# Patient Record
Sex: Female | Born: 1940 | Race: White | Hispanic: No | Marital: Married | Smoking: Never smoker
Health system: Southern US, Community
[De-identification: ages and names within clinical notes are randomized; demographics above are authoritative.]

## PROBLEM LIST (undated history)

## (undated) DIAGNOSIS — T4145XA Adverse effect of unspecified anesthetic, initial encounter: Secondary | ICD-10-CM

## (undated) DIAGNOSIS — Z8489 Family history of other specified conditions: Secondary | ICD-10-CM

## (undated) DIAGNOSIS — T8859XA Other complications of anesthesia, initial encounter: Secondary | ICD-10-CM

## (undated) HISTORY — PX: FOOT SURGERY: SHX648

## (undated) HISTORY — PX: BASAL CELL CARCINOMA EXCISION: SHX1214

## (undated) HISTORY — PX: MULTIPLE TOOTH EXTRACTIONS: SHX2053

## (undated) HISTORY — PX: OTHER SURGICAL HISTORY: SHX169

## (undated) HISTORY — PX: TONSILLECTOMY: SUR1361

---

## 1972-06-25 HISTORY — PX: TUBAL LIGATION: SHX77

## 1979-06-26 DIAGNOSIS — Z8489 Family history of other specified conditions: Secondary | ICD-10-CM

## 1979-06-26 HISTORY — DX: Family history of other specified conditions: Z84.89

## 1982-06-25 HISTORY — PX: DIAGNOSTIC LAPAROSCOPY: SUR761

## 1998-05-30 ENCOUNTER — Other Ambulatory Visit: Admission: RE | Admit: 1998-05-30 | Discharge: 1998-05-30 | Payer: Self-pay | Admitting: *Deleted

## 1998-06-25 HISTORY — PX: CHOLECYSTECTOMY: SHX55

## 1999-03-02 ENCOUNTER — Encounter: Payer: Self-pay | Admitting: Surgery

## 1999-03-02 ENCOUNTER — Encounter (INDEPENDENT_AMBULATORY_CARE_PROVIDER_SITE_OTHER): Payer: Self-pay | Admitting: Specialist

## 1999-03-02 ENCOUNTER — Observation Stay (HOSPITAL_COMMUNITY): Admission: RE | Admit: 1999-03-02 | Discharge: 1999-03-03 | Payer: Self-pay | Admitting: Surgery

## 1999-06-12 ENCOUNTER — Other Ambulatory Visit: Admission: RE | Admit: 1999-06-12 | Discharge: 1999-06-12 | Payer: Self-pay | Admitting: *Deleted

## 2002-10-28 ENCOUNTER — Other Ambulatory Visit: Admission: RE | Admit: 2002-10-28 | Discharge: 2002-10-28 | Payer: Self-pay | Admitting: *Deleted

## 2003-03-02 ENCOUNTER — Ambulatory Visit (HOSPITAL_COMMUNITY): Admission: RE | Admit: 2003-03-02 | Discharge: 2003-03-02 | Payer: Self-pay | Admitting: Gastroenterology

## 2004-03-01 ENCOUNTER — Other Ambulatory Visit: Admission: RE | Admit: 2004-03-01 | Discharge: 2004-03-01 | Payer: Self-pay | Admitting: *Deleted

## 2005-07-05 ENCOUNTER — Other Ambulatory Visit: Admission: RE | Admit: 2005-07-05 | Discharge: 2005-07-05 | Payer: Self-pay | Admitting: Obstetrics and Gynecology

## 2006-12-23 ENCOUNTER — Other Ambulatory Visit: Admission: RE | Admit: 2006-12-23 | Discharge: 2006-12-23 | Payer: Self-pay | Admitting: Obstetrics and Gynecology

## 2008-03-02 ENCOUNTER — Other Ambulatory Visit: Admission: RE | Admit: 2008-03-02 | Discharge: 2008-03-02 | Payer: Self-pay | Admitting: Obstetrics and Gynecology

## 2010-11-10 NOTE — Op Note (Signed)
   NAME:  Alison Lamb, Alison Lamb                     ACCOUNT NO.:  [id number]   MEDICAL RECORD NO.:  [id number]                   PATIENT TYPE:  AMB   LOCATION:  ENDO                                 FACILITY:  [facility]   PHYSICIAN:  Graylin Shiver, M.D.                DATE OF BIRTH:  [birth year]   DATE OF PROCEDURE:  03/02/2003  DATE OF DISCHARGE:                                 OPERATIVE REPORT   PROCEDURE:  Colonoscopy.   INDICATION FOR PROCEDURE:  Family history of colon cancer.   Informed consent was obtained after explanation of the risks of bleeding,  infection, and perforation.   PREMEDICATION:  Fentanyl 80 mcg IV, Versed 8 mg IV.   DESCRIPTION OF PROCEDURE:  With the patient in the left lateral decubitus  position, a rectal exam was performed and no masses were felt.  The Olympus  colonoscope was inserted into the rectum and advanced around the colon to  the cecum.  Cecal landmarks were identified.  The cecum and ascending colon  were normal, the transverse colon normal.  The descending and sigmoid  revealed diverticulosis.  The rectum was normal.  She tolerated the  procedure well without complications.   IMPRESSION:  Left-sided diverticulosis.   In view of the family history of colon cancer in her father, I would  recommend a follow-up colonoscopy again in five years.                                               Graylin Shiver, M.D.    Germain Osgood  D:  03/02/2003  T:  03/02/2003  Job:  [id number]   cc:   Cassell Clement, M.D.  [address], [address]  [place]  [place] [address]  Fax: [phone]

## 2013-07-14 ENCOUNTER — Other Ambulatory Visit: Payer: Self-pay | Admitting: Gastroenterology

## 2013-07-14 DIAGNOSIS — K5732 Diverticulitis of large intestine without perforation or abscess without bleeding: Secondary | ICD-10-CM

## 2013-07-16 ENCOUNTER — Ambulatory Visit
Admission: RE | Admit: 2013-07-16 | Discharge: 2013-07-16 | Disposition: A | Discharge status: Home / Self Care | Payer: Medicare Other | Source: Ambulatory Visit | Attending: Gastroenterology | Admitting: Gastroenterology

## 2013-07-16 DIAGNOSIS — K5732 Diverticulitis of large intestine without perforation or abscess without bleeding: Secondary | ICD-10-CM

## 2013-07-16 MED ORDER — IOHEXOL 300 MG/ML  SOLN
100.0000 mL | Freq: Once | INTRAMUSCULAR | Status: AC | PRN
  Administered 2013-07-16: 100 mL via INTRAVENOUS

## 2015-03-23 IMAGING — CT CT ABD-PELV W/ CM
2 of 5 series · 17 of 46 positions shown, 19 images · IV contrast (READICAT/WATER & [ID] OMNI 300)
Comparison: None.

CLINICAL DATA: Left lower quadrant pain, history of diverticulitis,
finished antibiotics 4 days ago

EXAM:
CT ABDOMEN AND PELVIS WITH CONTRAST
TECHNIQUE: Multidetector CT imaging of the abdomen and pelvis was performed
using the standard protocol following bolus administration of
intravenous contrast.
BUN and creatinine were obtained on site at [HOSPITAL] at
[HOSPITAL].
Results:  BUN 9 mg/dL,  Creatinine 0.9 mg/dL.
CONTRAST:  100mL OMNIPAQUE IOHEXOL 300 MG/ML  SOLN

[Series 2: abd/pelvis with · axial · 0.74mm/px · z∈[-332,+53]mm · 14 of 87 slices shown, 16 images]
[im 5/87  soft-tissue]
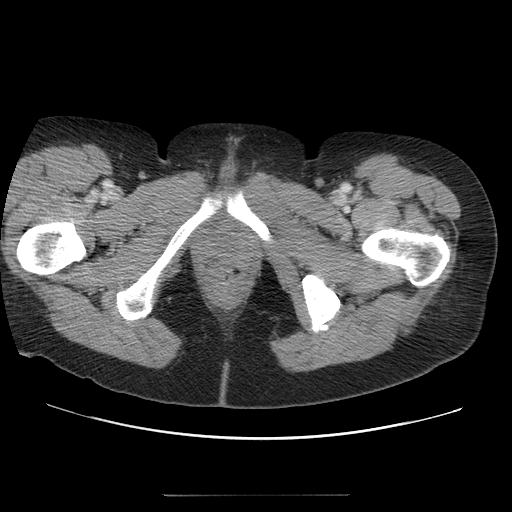
[im 5/87  bone]
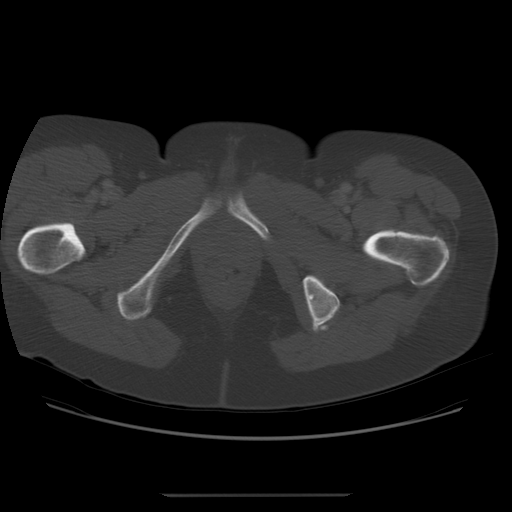
[im 10/87  soft-tissue]
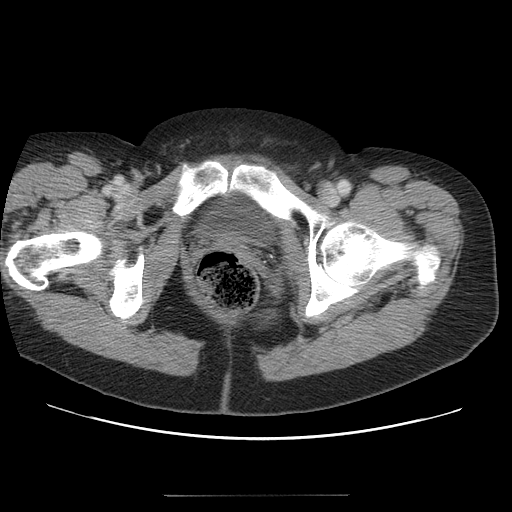
[im 19/87  soft-tissue]
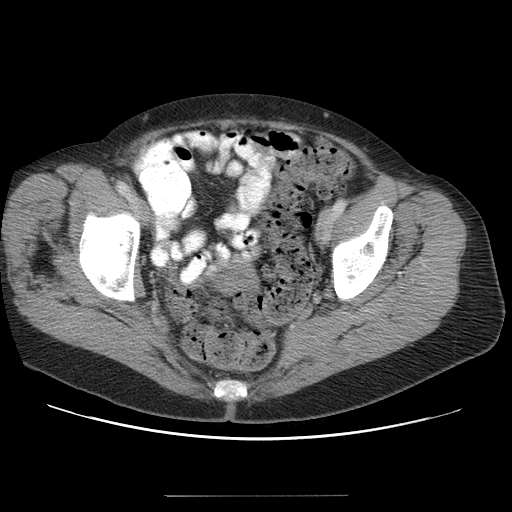
[im 23/87  soft-tissue]
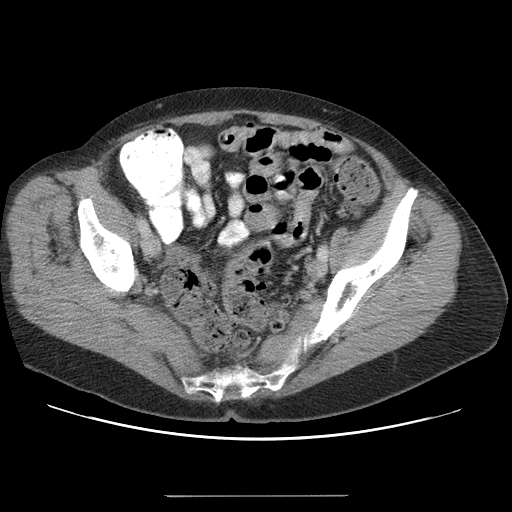
[im 28/87  soft-tissue]
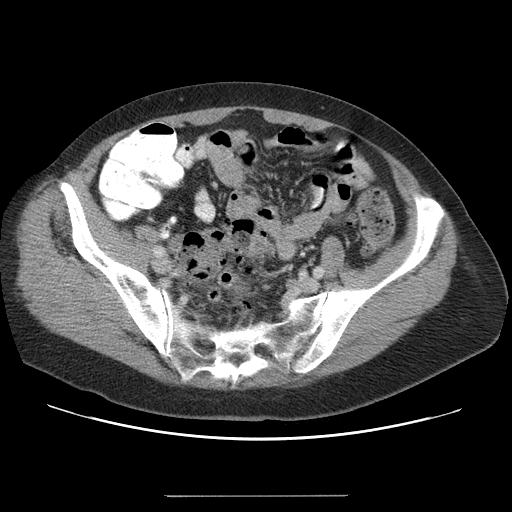
[im 37/87  soft-tissue]
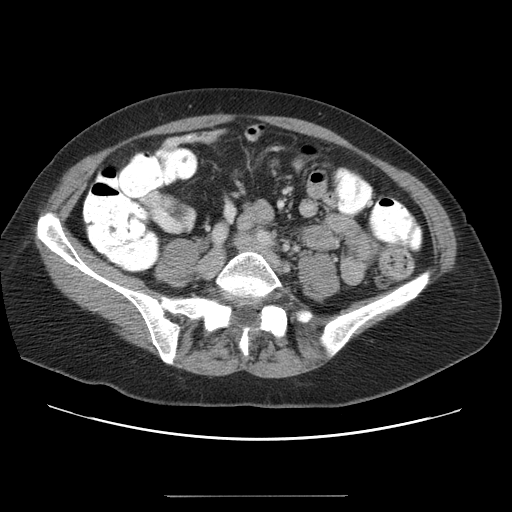
[im 41/87  soft-tissue]
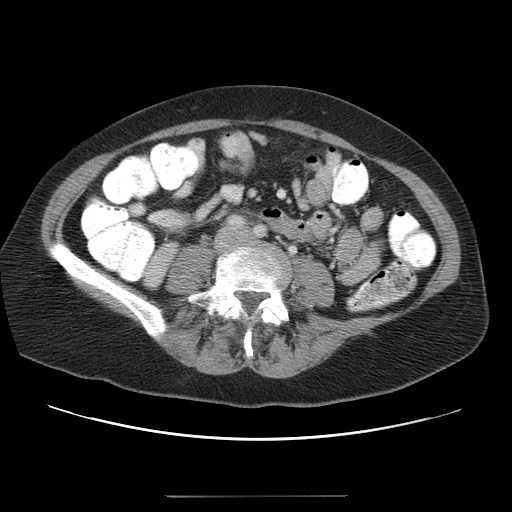
[im 46/87  soft-tissue]
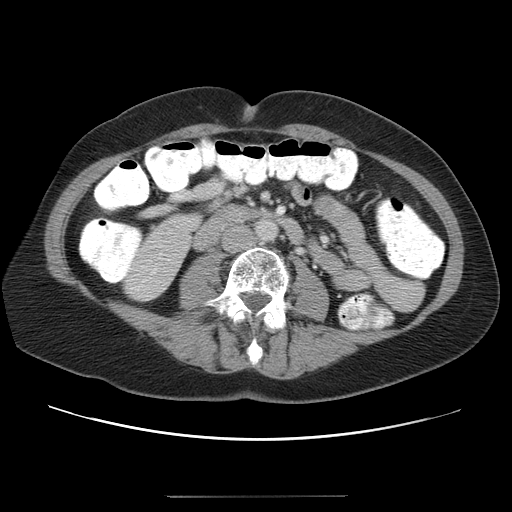
[im 50/87  soft-tissue]
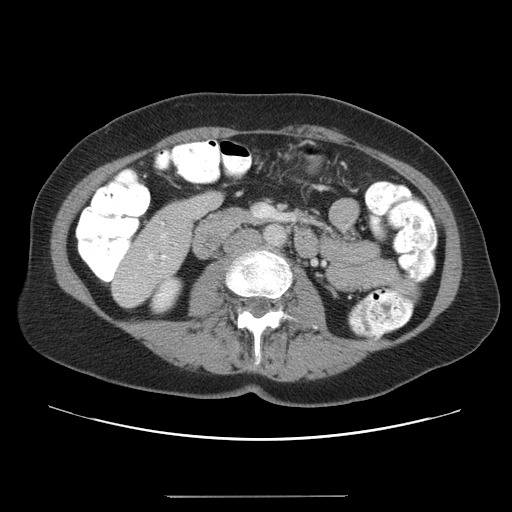
[im 50/87  bone]
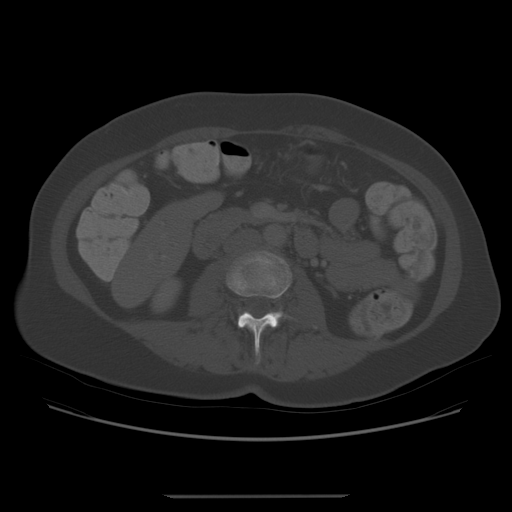
[im 59/87  soft-tissue]
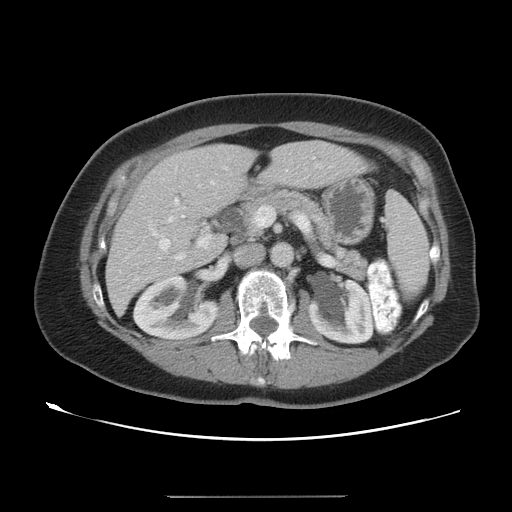
[im 64/87  soft-tissue]
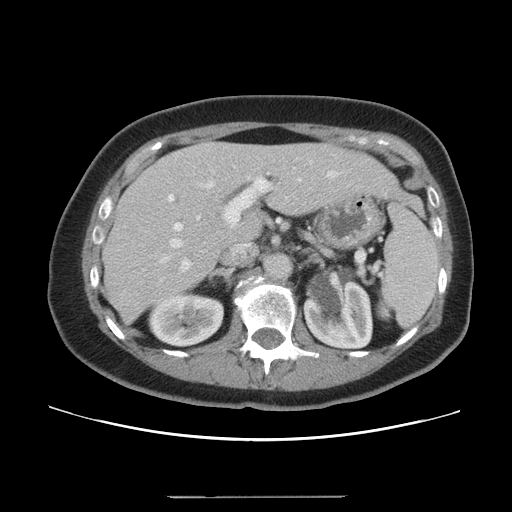
[im 68/87  soft-tissue]
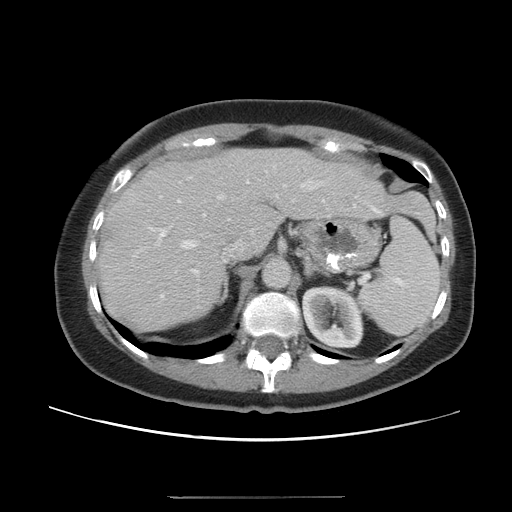
[im 77/87  soft-tissue]
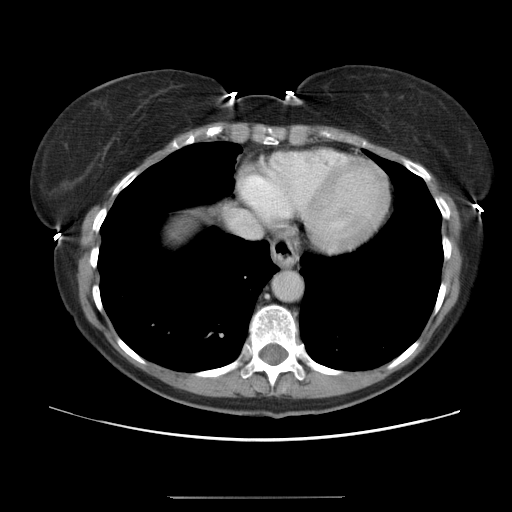
[im 82/87  soft-tissue]
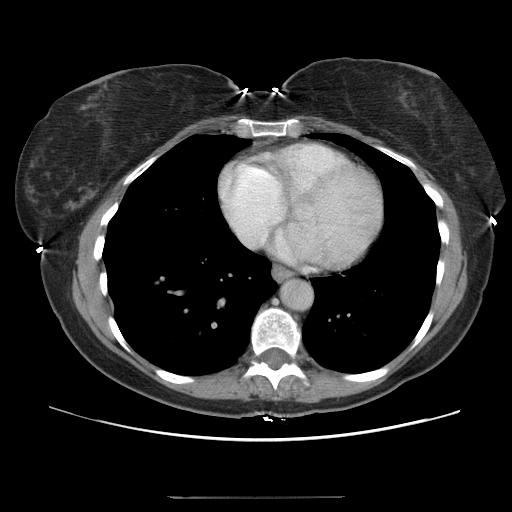

[Series 400: coronal · coronal · 0.92mm/px · 3 of 113 slices shown]
[im 38/113  soft-tissue]
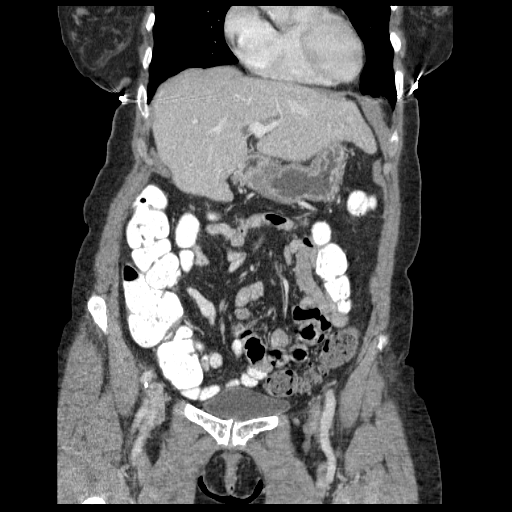
[im 50/113  soft-tissue]
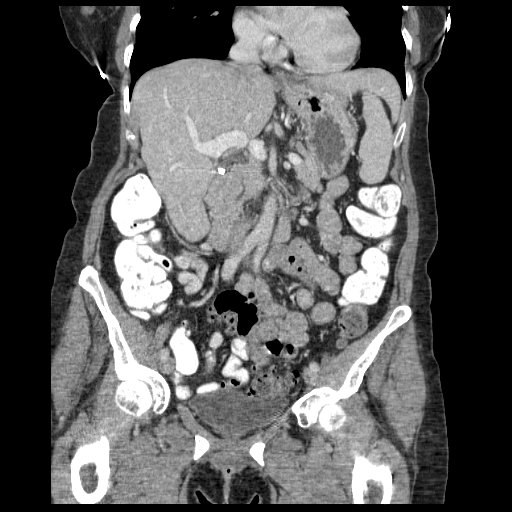
[im 63/113  soft-tissue]
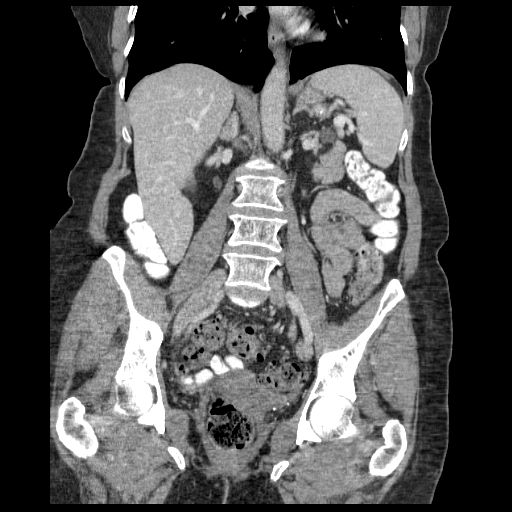

[17 of 46 positions shown; findings below may reference images not displayed]

FINDINGS: Mild dependent atelectasis in the lung bases.

Small hiatal hernia.

Liver, spleen, pancreas, adrenal glands are within normal limits.

Status post cholecystectomy. No intrahepatic ductal dilatation.
Common duct measures 9 mm, within normal limits for postsurgical
appearance.

Bilateral renal sinus cysts, left greater than right. Kidneys are
otherwise within normal limits. No hydronephrosis.

No evidence of bowel obstruction. Normal appendix. Extensive colonic
diverticulosis, without convincing inflammatory changes to suggest
acute diverticulitis.

No evidence of abdominal aortic aneurysm.

No abdominopelvic ascites.

No suspicious abdominopelvic lymphadenopathy.

Uterus and right ovary are unremarkable.  No left adnexal mass.

Bladder is within normal limits.

Mild degenerative changes of the visualized thoracolumbar spine.
IMPRESSION: Extensive colonic diverticulosis, without findings to suggest
diverticulitis.

No evidence of bowel obstruction.  Normal appendix.

Bilateral renal sinus cysts.  No hydronephrosis.

## 2016-07-04 DIAGNOSIS — J322 Chronic ethmoidal sinusitis: Secondary | ICD-10-CM | POA: Diagnosis not present

## 2016-07-04 DIAGNOSIS — H8141 Vertigo of central origin, right ear: Secondary | ICD-10-CM | POA: Diagnosis not present

## 2016-07-04 DIAGNOSIS — J040 Acute laryngitis: Secondary | ICD-10-CM | POA: Diagnosis not present

## 2016-07-04 DIAGNOSIS — J320 Chronic maxillary sinusitis: Secondary | ICD-10-CM | POA: Diagnosis not present

## 2016-07-04 DIAGNOSIS — H9311 Tinnitus, right ear: Secondary | ICD-10-CM | POA: Diagnosis not present

## 2016-12-28 DIAGNOSIS — Z1231 Encounter for screening mammogram for malignant neoplasm of breast: Secondary | ICD-10-CM | POA: Diagnosis not present

## 2017-01-10 DIAGNOSIS — H52223 Regular astigmatism, bilateral: Secondary | ICD-10-CM | POA: Diagnosis not present

## 2017-01-10 DIAGNOSIS — H5203 Hypermetropia, bilateral: Secondary | ICD-10-CM | POA: Diagnosis not present

## 2017-01-10 DIAGNOSIS — H04123 Dry eye syndrome of bilateral lacrimal glands: Secondary | ICD-10-CM | POA: Diagnosis not present

## 2017-01-10 DIAGNOSIS — H524 Presbyopia: Secondary | ICD-10-CM | POA: Diagnosis not present

## 2017-01-10 DIAGNOSIS — H25813 Combined forms of age-related cataract, bilateral: Secondary | ICD-10-CM | POA: Diagnosis not present

## 2017-01-15 DIAGNOSIS — L820 Inflamed seborrheic keratosis: Secondary | ICD-10-CM | POA: Diagnosis not present

## 2017-01-15 DIAGNOSIS — L720 Epidermal cyst: Secondary | ICD-10-CM | POA: Diagnosis not present

## 2017-01-15 DIAGNOSIS — Z85828 Personal history of other malignant neoplasm of skin: Secondary | ICD-10-CM | POA: Diagnosis not present

## 2017-01-15 DIAGNOSIS — D485 Neoplasm of uncertain behavior of skin: Secondary | ICD-10-CM | POA: Diagnosis not present

## 2017-01-15 DIAGNOSIS — L821 Other seborrheic keratosis: Secondary | ICD-10-CM | POA: Diagnosis not present

## 2017-01-15 DIAGNOSIS — D1801 Hemangioma of skin and subcutaneous tissue: Secondary | ICD-10-CM | POA: Diagnosis not present

## 2017-01-15 DIAGNOSIS — L570 Actinic keratosis: Secondary | ICD-10-CM | POA: Diagnosis not present

## 2017-01-15 DIAGNOSIS — L814 Other melanin hyperpigmentation: Secondary | ICD-10-CM | POA: Diagnosis not present

## 2017-03-06 DIAGNOSIS — Z23 Encounter for immunization: Secondary | ICD-10-CM | POA: Diagnosis not present

## 2017-03-18 DIAGNOSIS — M25562 Pain in left knee: Secondary | ICD-10-CM | POA: Diagnosis not present

## 2017-03-25 DIAGNOSIS — Z8489 Family history of other specified conditions: Secondary | ICD-10-CM

## 2017-03-25 HISTORY — DX: Family history of other specified conditions: Z84.89

## 2017-04-11 DIAGNOSIS — M25562 Pain in left knee: Secondary | ICD-10-CM | POA: Diagnosis not present

## 2017-04-19 DIAGNOSIS — M25562 Pain in left knee: Secondary | ICD-10-CM | POA: Diagnosis not present

## 2017-04-19 DIAGNOSIS — M238X2 Other internal derangements of left knee: Secondary | ICD-10-CM | POA: Diagnosis not present

## 2017-04-24 DIAGNOSIS — Z0000 Encounter for general adult medical examination without abnormal findings: Secondary | ICD-10-CM | POA: Diagnosis not present

## 2017-04-24 DIAGNOSIS — E781 Pure hyperglyceridemia: Secondary | ICD-10-CM | POA: Diagnosis not present

## 2017-04-24 DIAGNOSIS — Z1389 Encounter for screening for other disorder: Secondary | ICD-10-CM | POA: Diagnosis not present

## 2017-04-25 DIAGNOSIS — M23332 Other meniscus derangements, other medial meniscus, left knee: Secondary | ICD-10-CM | POA: Diagnosis not present

## 2017-04-25 DIAGNOSIS — M25562 Pain in left knee: Secondary | ICD-10-CM | POA: Diagnosis not present

## 2017-06-20 ENCOUNTER — Other Ambulatory Visit: Payer: Self-pay

## 2017-06-20 ENCOUNTER — Encounter (HOSPITAL_COMMUNITY): Payer: Self-pay | Admitting: *Deleted

## 2017-06-20 NOTE — Progress Notes (Signed)
PATIENT OFFERRED PRE OP APPOINTMENT WHEN TOLD SHE WAS BACK IN TOWN TODAY DUE TO HISTORY OF FAMILY  ANESTHESIA  ISSUES  AND PATIENT STATED " I CANNOT COME FOR A PRE OP APPOINMENT TODAY DUE  TO I  HAVE A HOUSE FULL OF GIRLS COMING OVER SOON "

## 2017-06-21 ENCOUNTER — Ambulatory Visit (HOSPITAL_COMMUNITY): Payer: PPO | Admitting: Anesthesiology

## 2017-06-21 ENCOUNTER — Encounter (HOSPITAL_COMMUNITY): Payer: Self-pay | Admitting: *Deleted

## 2017-06-21 ENCOUNTER — Ambulatory Visit (HOSPITAL_COMMUNITY)
Admission: RE | Admit: 2017-06-21 | Discharge: 2017-06-21 | Disposition: A | Discharge status: Home / Self Care | Payer: PPO | Source: Ambulatory Visit | Attending: Orthopedic Surgery | Admitting: Orthopedic Surgery

## 2017-06-21 ENCOUNTER — Encounter (HOSPITAL_COMMUNITY)
Admission: RE | Disposition: A | Discharge status: Home / Self Care | Payer: Self-pay | Source: Ambulatory Visit | Attending: Orthopedic Surgery

## 2017-06-21 DIAGNOSIS — M2242 Chondromalacia patellae, left knee: Secondary | ICD-10-CM | POA: Diagnosis not present

## 2017-06-21 DIAGNOSIS — Z85828 Personal history of other malignant neoplasm of skin: Secondary | ICD-10-CM | POA: Diagnosis not present

## 2017-06-21 DIAGNOSIS — Z5333 Arthroscopic surgical procedure converted to open procedure: Secondary | ICD-10-CM | POA: Diagnosis not present

## 2017-06-21 DIAGNOSIS — Z79899 Other long term (current) drug therapy: Secondary | ICD-10-CM | POA: Insufficient documentation

## 2017-06-21 DIAGNOSIS — S83282A Other tear of lateral meniscus, current injury, left knee, initial encounter: Secondary | ICD-10-CM | POA: Insufficient documentation

## 2017-06-21 DIAGNOSIS — S83242A Other tear of medial meniscus, current injury, left knee, initial encounter: Secondary | ICD-10-CM | POA: Insufficient documentation

## 2017-06-21 DIAGNOSIS — Y929 Unspecified place or not applicable: Secondary | ICD-10-CM | POA: Insufficient documentation

## 2017-06-21 DIAGNOSIS — M659 Synovitis and tenosynovitis, unspecified: Secondary | ICD-10-CM | POA: Diagnosis not present

## 2017-06-21 DIAGNOSIS — M1712 Unilateral primary osteoarthritis, left knee: Secondary | ICD-10-CM | POA: Diagnosis not present

## 2017-06-21 DIAGNOSIS — M65862 Other synovitis and tenosynovitis, left lower leg: Secondary | ICD-10-CM | POA: Diagnosis not present

## 2017-06-21 DIAGNOSIS — X58XXXA Exposure to other specified factors, initial encounter: Secondary | ICD-10-CM | POA: Insufficient documentation

## 2017-06-21 DIAGNOSIS — M23304 Other meniscus derangements, unspecified medial meniscus, left knee: Secondary | ICD-10-CM | POA: Diagnosis not present

## 2017-06-21 DIAGNOSIS — M23301 Other meniscus derangements, unspecified lateral meniscus, left knee: Secondary | ICD-10-CM | POA: Diagnosis not present

## 2017-06-21 HISTORY — DX: Other complications of anesthesia, initial encounter: T88.59XA

## 2017-06-21 HISTORY — PX: KNEE ARTHROSCOPY WITH MEDIAL MENISECTOMY: SHX5651

## 2017-06-21 HISTORY — DX: Adverse effect of unspecified anesthetic, initial encounter: T41.45XA

## 2017-06-21 HISTORY — DX: Family history of other specified conditions: Z84.89

## 2017-06-21 LAB — COMPREHENSIVE METABOLIC PANEL
ALT: 18 U/L (ref 14–54)
AST: 23 U/L (ref 15–41)
Albumin: 3.9 g/dL (ref 3.5–5.0)
Alkaline Phosphatase: 67 U/L (ref 38–126)
Anion gap: 7 (ref 5–15)
BUN: 14 mg/dL (ref 6–20)
CO2: 26 mmol/L (ref 22–32)
Calcium: 8.7 mg/dL — ABNORMAL LOW (ref 8.9–10.3)
Chloride: 107 mmol/L (ref 101–111)
Creatinine, Ser: 0.77 mg/dL (ref 0.44–1.00)
GFR calc Af Amer: >60 mL/min (ref >60)
GFR calc non Af Amer: >60 mL/min (ref >60)
Glucose, Bld: 97 mg/dL (ref 65–99)
Potassium: 3.7 mmol/L (ref 3.5–5.1)
Sodium: 140 mmol/L (ref 135–145)
Total Bilirubin: 0.6 mg/dL (ref 0.3–1.2)
Total Protein: 6.9 g/dL (ref 6.5–8.1)

## 2017-06-21 LAB — CBC WITH DIFFERENTIAL/PLATELET
Basophils Absolute: 0 10*3/uL (ref 0.0–0.1)
Basophils Relative: 0 %
Eosinophils Absolute: 0.1 10*3/uL (ref 0.0–0.7)
Eosinophils Relative: 2 %
HCT: 39.7 % (ref 36.0–46.0)
Hemoglobin: 13 g/dL (ref 12.0–15.0)
Lymphocytes Relative: 26 %
Lymphs Abs: 1.4 10*3/uL (ref 0.7–4.0)
MCH: 29.2 pg (ref 26.0–34.0)
MCHC: 32.7 g/dL (ref 30.0–36.0)
MCV: 89.2 fL (ref 78.0–100.0)
Monocytes Absolute: 0.4 10*3/uL (ref 0.1–1.0)
Monocytes Relative: 7 %
Neutro Abs: 3.6 10*3/uL (ref 1.7–7.7)
Neutrophils Relative %: 65 %
Platelets: 195 10*3/uL (ref 150–400)
RBC: 4.45 MIL/uL (ref 3.87–5.11)
RDW: 13.3 % (ref 11.5–15.5)
WBC: 5.6 10*3/uL (ref 4.0–10.5)

## 2017-06-21 LAB — PROTIME-INR
INR: 0.95
Prothrombin Time: 12.6 seconds (ref 11.4–15.2)

## 2017-06-21 LAB — APTT: aPTT: 30 seconds (ref 24–36)

## 2017-06-21 SURGERY — ARTHROSCOPY, KNEE, WITH MEDIAL MENISCECTOMY
Anesthesia: Spinal | Site: Knee | Laterality: Left

## 2017-06-21 MED ORDER — BUPIVACAINE-EPINEPHRINE 0.25% -1:200000 IJ SOLN
INTRAMUSCULAR | Status: DC | PRN
  Administered 2017-06-21: 20 mL

## 2017-06-21 MED ORDER — BACITRACIN ZINC 500 UNIT/GM EX OINT
TOPICAL_OINTMENT | CUTANEOUS | Status: AC
  Filled 2017-06-21: qty 28.35

## 2017-06-21 MED ORDER — PROPOFOL 10 MG/ML IV BOLUS
INTRAVENOUS | Status: AC
  Filled 2017-06-21: qty 20

## 2017-06-21 MED ORDER — BUPIVACAINE-EPINEPHRINE 0.25% -1:200000 IJ SOLN
INTRAMUSCULAR | Status: AC
  Filled 2017-06-21: qty 1

## 2017-06-21 MED ORDER — PHENYLEPHRINE 40 MCG/ML (10ML) SYRINGE FOR IV PUSH (FOR BLOOD PRESSURE SUPPORT)
PREFILLED_SYRINGE | INTRAVENOUS | Status: AC
  Filled 2017-06-21: qty 10

## 2017-06-21 MED ORDER — BACITRACIN ZINC 500 UNIT/GM EX OINT
TOPICAL_OINTMENT | CUTANEOUS | Status: DC | PRN
  Administered 2017-06-21: 1 via TOPICAL

## 2017-06-21 MED ORDER — CHLORHEXIDINE GLUCONATE 4 % EX LIQD: 60.0000 mL | Freq: Once | CUTANEOUS | Status: DC

## 2017-06-21 MED ORDER — DEXAMETHASONE SODIUM PHOSPHATE 10 MG/ML IJ SOLN
INTRAMUSCULAR | Status: AC
  Filled 2017-06-21: qty 1

## 2017-06-21 MED ORDER — SODIUM CHLORIDE 0.9% FLUSH: 3.0000 mL | Freq: Two times a day (BID) | INTRAVENOUS | Status: DC

## 2017-06-21 MED ORDER — PROPOFOL 10 MG/ML IV BOLUS
INTRAVENOUS | Status: AC
Start: 2017-06-21 — End: 2017-06-21
  Filled 2017-06-21: qty 40

## 2017-06-21 MED ORDER — ONDANSETRON HCL 4 MG/2ML IJ SOLN
INTRAMUSCULAR | Status: DC | PRN
  Administered 2017-06-21: 4 mg via INTRAVENOUS

## 2017-06-21 MED ORDER — SODIUM CHLORIDE 0.9 % IR SOLN
Status: DC | PRN
  Administered 2017-06-21: 12000 mL

## 2017-06-21 MED ORDER — PHENYLEPHRINE HCL 10 MG/ML IJ SOLN
INTRAMUSCULAR | Status: DC | PRN
  Administered 2017-06-21 (×2): 40 ug via INTRAVENOUS

## 2017-06-21 MED ORDER — LACTATED RINGERS IV SOLN
INTRAVENOUS | Status: DC
  Administered 2017-06-21 (×2): via INTRAVENOUS

## 2017-06-21 MED ORDER — SODIUM CHLORIDE 0.9% FLUSH: 3.0000 mL | INTRAVENOUS | Status: DC | PRN

## 2017-06-21 MED ORDER — ONDANSETRON HCL 4 MG/2ML IJ SOLN
INTRAMUSCULAR | Status: AC
  Filled 2017-06-21: qty 2

## 2017-06-21 MED ORDER — LIDOCAINE 2% (20 MG/ML) 5 ML SYRINGE
INTRAMUSCULAR | Status: AC
  Filled 2017-06-21: qty 5

## 2017-06-21 MED ORDER — CHLORHEXIDINE GLUCONATE 4 % EX LIQD
60.0000 mL | Freq: Once | CUTANEOUS | Status: AC
  Administered 2017-06-21: 4 via TOPICAL

## 2017-06-21 MED ORDER — CEFAZOLIN SODIUM-DEXTROSE 2-4 GM/100ML-% IV SOLN
2.0000 g | INTRAVENOUS | Status: AC
  Administered 2017-06-21: 2 g via INTRAVENOUS
  Filled 2017-06-21: qty 100

## 2017-06-21 MED ORDER — ACETAMINOPHEN 650 MG RE SUPP
650.0000 mg | RECTAL | Status: DC | PRN
  Filled 2017-06-21: qty 1

## 2017-06-21 MED ORDER — PROPOFOL 500 MG/50ML IV EMUL
INTRAVENOUS | Status: DC | PRN
  Administered 2017-06-21 (×2): 20 mg via INTRAVENOUS

## 2017-06-21 MED ORDER — PROPOFOL 500 MG/50ML IV EMUL
INTRAVENOUS | Status: DC | PRN
  Administered 2017-06-21: 50 ug/kg/min via INTRAVENOUS

## 2017-06-21 MED ORDER — OXYCODONE HCL 5 MG PO TABS: 5.0000 mg | ORAL_TABLET | ORAL | Status: DC | PRN

## 2017-06-21 MED ORDER — FENTANYL CITRATE (PF) 100 MCG/2ML IJ SOLN
INTRAMUSCULAR | Status: AC
  Filled 2017-06-21: qty 2

## 2017-06-21 MED ORDER — ACETAMINOPHEN 325 MG PO TABS: 650.0000 mg | ORAL_TABLET | ORAL | Status: DC | PRN

## 2017-06-21 MED ORDER — SODIUM CHLORIDE 0.9 % IV SOLN: 250.0000 mL | INTRAVENOUS | Status: DC | PRN

## 2017-06-21 MED ORDER — 0.9 % SODIUM CHLORIDE (POUR BTL) OPTIME
TOPICAL | Status: DC | PRN
  Administered 2017-06-21: 1000 mL

## 2017-06-21 SURGICAL SUPPLY — 30 items
BANDAGE ACE 4X5 VEL STRL LF (GAUZE/BANDAGES/DRESSINGS) ×2 IMPLANT
BANDAGE ACE 6X5 VEL STRL LF (GAUZE/BANDAGES/DRESSINGS) ×2 IMPLANT
BLADE GREAT WHITE 4.2 (BLADE) ×2 IMPLANT
BLADE SURG SZ11 CARB STEEL (BLADE) IMPLANT
BNDG COHESIVE 6X5 TAN STRL LF (GAUZE/BANDAGES/DRESSINGS) ×2 IMPLANT
CLOTH BEACON ORANGE TIMEOUT ST (SAFETY) ×2 IMPLANT
COUNTER NEEDLE 20 DBL MAG RED (NEEDLE) ×2 IMPLANT
COVER SURGICAL LIGHT HANDLE (MISCELLANEOUS) ×2 IMPLANT
DRAPE SHEET LG 3/4 BI-LAMINATE (DRAPES) ×2 IMPLANT
DRSG ADAPTIC 3X8 NADH LF (GAUZE/BANDAGES/DRESSINGS) ×2 IMPLANT
DRSG EMULSION OIL 3X16 NADH (GAUZE/BANDAGES/DRESSINGS) ×2 IMPLANT
DRSG PAD ABDOMINAL 8X10 ST (GAUZE/BANDAGES/DRESSINGS) ×4 IMPLANT
DURAPREP 26ML APPLICATOR (WOUND CARE) ×2 IMPLANT
GAUZE SPONGE 4X4 12PLY STRL (GAUZE/BANDAGES/DRESSINGS) ×2 IMPLANT
GLOVE BIOGEL PI IND STRL 8.5 (GLOVE) ×1 IMPLANT
GLOVE BIOGEL PI INDICATOR 8.5 (GLOVE) ×1
GLOVE ECLIPSE 8.0 STRL XLNG CF (GLOVE) ×2 IMPLANT
GOWN STRL REUS W/TWL XL LVL3 (GOWN DISPOSABLE) ×2 IMPLANT
KIT BASIN OR (CUSTOM PROCEDURE TRAY) IMPLANT
MANIFOLD NEPTUNE II (INSTRUMENTS) ×2 IMPLANT
PACK ARTHROSCOPY WL (CUSTOM PROCEDURE TRAY) ×2 IMPLANT
PACK ICE MAXI GEL EZY WRAP (MISCELLANEOUS) ×2 IMPLANT
PAD ABD 8X10 STRL (GAUZE/BANDAGES/DRESSINGS) ×2 IMPLANT
PAD MASON LEG HOLDER (PIN) ×2 IMPLANT
SUT ETHILON 3 0 PS 1 (SUTURE) ×2 IMPLANT
TOWEL OR 17X26 10 PK STRL BLUE (TOWEL DISPOSABLE) ×2 IMPLANT
TUBING ARTHRO INFLOW-ONLY STRL (TUBING) ×2 IMPLANT
TUBING CONNECTING 10 (TUBING) IMPLANT
WAND HAND CNTRL MULTIVAC 90 (MISCELLANEOUS) ×2 IMPLANT
WRAP KNEE MAXI GEL POST OP (GAUZE/BANDAGES/DRESSINGS) ×2 IMPLANT

## 2017-06-21 NOTE — H&P (Signed)
Alison Lamb is an 76 y.o. female.   Chief Complaint: Pain and swelling of her Left Knee. HPI: She complained of a painful range of motion of her Left Knee. She had a positive MRI of her Left knee which revealed a Torn Medial Meniscus.  Past Medical History:  Diagnosis Date  . Complication of anesthesia 20 YRS AGO   SLOW TO AWAKEN AFTER CHOLECYSTECTOMY  . Family history of adverse reaction to anesthesia 1981   FATHER DIED DURING COLON SURGERY DID NOT WAKE UP AFTER SURGERY FEW DETAILS GIVEN TO PATIENT   . Family history of anesthesia complication 23/5361   SISTER WITH PAIN AFTER GALLBLADDER SURGERY, YOUNGER SISTER TROUBLE TROUBLE WAKING UP AFTER KNEE SURGERY IN APRL 2018    Past Surgical History:  Procedure Laterality Date  . BASAL CELL CARCINOMA EXCISION     SEVERAL REMOVED  . CHOLECYSTECTOMY  2000  . COLONSCOPY  EVERY 5 YEARS  . DIAGNOSTIC LAPAROSCOPY  1984   FOR OVARY CYST  . FOOT SURGERY Right 1990'S   BONE SURGERY  . MOES PROCEDURE     X 2  . MULTIPLE TOOTH EXTRACTIONS    . TONSILLECTOMY  AGE 26  . TUBAL LIGATION  1974    History reviewed. No pertinent family history. Social History:  reports that  has never smoked. she has never used smokeless tobacco. She reports that she does not drink alcohol or use drugs.  Allergies: No Known Allergies  Medications Prior to Admission  Medication Sig Dispense Refill  . Ascorbic Acid (VITAMIN C PO) Take 1 tablet by mouth daily.    Marland Kitchen CALCIUM-VITAMIN D PO Take 1 tablet by mouth 4 (four) times a week.    Marland Kitchen ibuprofen (ADVIL,MOTRIN) 200 MG tablet Take 200-400 mg by mouth every 6 (six) hours as needed for headache or moderate pain.      Results for orders placed or performed during the hospital encounter of 06/21/17 (from the past 48 hour(s))  CBC WITH DIFFERENTIAL     Status: None   Collection Time: 06/21/17 11:25 AM  Result Value Ref Range   WBC 5.6 4.0 - 10.5 K/uL   RBC 4.45 3.87 - 5.11 MIL/uL   Hemoglobin 13.0 12.0 - 15.0  g/dL   HCT 39.7 36.0 - 46.0 %   MCV 89.2 78.0 - 100.0 fL   MCH 29.2 26.0 - 34.0 pg   MCHC 32.7 30.0 - 36.0 g/dL   RDW 13.3 11.5 - 15.5 %   Platelets 195 150 - 400 K/uL   Neutrophils Relative % 65 %   Neutro Abs 3.6 1.7 - 7.7 K/uL   Lymphocytes Relative 26 %   Lymphs Abs 1.4 0.7 - 4.0 K/uL   Monocytes Relative 7 %   Monocytes Absolute 0.4 0.1 - 1.0 K/uL   Eosinophils Relative 2 %   Eosinophils Absolute 0.1 0.0 - 0.7 K/uL   Basophils Relative 0 %   Basophils Absolute 0.0 0.0 - 0.1 K/uL  Comprehensive metabolic panel     Status: Abnormal   Collection Time: 06/21/17 11:25 AM  Result Value Ref Range   Sodium 140 135 - 145 mmol/L   Potassium 3.7 3.5 - 5.1 mmol/L   Chloride 107 101 - 111 mmol/L   CO2 26 22 - 32 mmol/L   Glucose, Bld 97 65 - 99 mg/dL   BUN 14 6 - 20 mg/dL   Creatinine, Ser 0.77 0.44 - 1.00 mg/dL   Calcium 8.7 (L) 8.9 - 10.3 mg/dL   Total  Protein 6.9 6.5 - 8.1 g/dL   Albumin 3.9 3.5 - 5.0 g/dL   AST 23 15 - 41 U/L   ALT 18 14 - 54 U/L   Alkaline Phosphatase 67 38 - 126 U/L   Total Bilirubin 0.6 0.3 - 1.2 mg/dL   GFR calc non Af Amer >60 >60 mL/min   GFR calc Af Amer >60 >60 mL/min    Comment: (NOTE) The eGFR has been calculated using the CKD EPI equation. This calculation has not been validated in all clinical situations. eGFR's persistently <60 mL/min signify possible Chronic Kidney Disease.    Anion gap 7 5 - 15  Protime-INR     Status: None   Collection Time: 06/21/17 11:25 AM  Result Value Ref Range   Prothrombin Time 12.6 11.4 - 15.2 seconds   INR 0.95   APTT     Status: None   Collection Time: 06/21/17 11:25 AM  Result Value Ref Range   aPTT 30 24 - 36 seconds   No results found.  Review of Systems  Constitutional: Negative.   HENT: Negative.   Eyes: Negative.   Respiratory: Negative.   Cardiovascular: Negative.   Gastrointestinal: Negative.   Genitourinary: Negative.   Musculoskeletal: Positive for joint pain.  Skin: Negative.    Neurological: Negative.   Endo/Heme/Allergies: Negative.   Psychiatric/Behavioral: Negative.     Blood pressure (!) 158/79, pulse 68, temperature 97.8 F (36.6 C), temperature source Oral, resp. rate 16, height 5' 6"  (1.676 m), weight 77.6 kg (171 lb), SpO2 100 %. Physical Exam  HENT:  Head: Normocephalic.  Eyes: Pupils are equal, round, and reactive to light.  Cardiovascular: Normal rate.  Respiratory: Effort normal.  GI: Soft.  Musculoskeletal:  Painful Range of Motion of her Left Knee.  Neurological: She is alert.  Skin: Skin is warm.  Psychiatric: She has a normal mood and affect.     Assessment/Plan Diagnostic arthroscopy and Medial Meniscectomy of her Left Knee.  Latanya Maudlin, MD 06/21/2017, 1:12 PM

## 2017-06-21 NOTE — Progress Notes (Signed)
Patient up to bathroom with walker after spinal for knee arthroscopy. Full weight bearing on left leg. Incontinent of urine in stretcher and when ambulating to bathroom.

## 2017-06-21 NOTE — Anesthesia Procedure Notes (Signed)
Performed by: Rashanda Magloire G, CRNA       

## 2017-06-21 NOTE — Interval H&P Note (Signed)
History and Physical Interval Note:  06/21/2017 1:17 PM  Alison Lamb [facility]  has presented today for surgery, with the diagnosis of Left knee medial meniscal tear  The various methods of treatment have been discussed with the patient and family. After consideration of risks, benefits and other options for treatment, the patient has consented to  Procedure(s): LEFT KNEE ARTHROSCOPY WITH MEDIAL MENISCAL DEBRIDEMENT (Left) as a surgical intervention .  The patient's history has been reviewed, patient examined, no change in status, stable for surgery.  I have reviewed the patient's chart and labs.  Questions were answered to the patient's satisfaction.     Latanya Maudlin

## 2017-06-21 NOTE — Transfer of Care (Signed)
Immediate Anesthesia Transfer of Care Note  Patient: Alison Lamb [facility]  Procedure(s) Performed: LEFT KNEE ARTHROSCOPY WITH MEDIAL MENISCAL DEBRIDEMENT (Left Knee)  Patient Location: PACU  Anesthesia Type:MAC and Spinal  Level of Consciousness: awake, alert  and oriented  Airway & Oxygen Therapy: Patient Spontanous Breathing and Patient connected to face mask oxygen  Post-op Assessment: Report given to RN and Post -op Vital signs reviewed and stable  Post vital signs: Reviewed and stable  Last Vitals:  Vitals:   06/21/17 1113  BP: (!) 158/79  Pulse: 68  Resp: 16  Temp: 36.6 C  SpO2: 100%    Last Pain:  Vitals:   06/21/17 1113  TempSrc: Oral         Complications: No apparent anesthesia complications

## 2017-06-21 NOTE — Anesthesia Procedure Notes (Signed)
Spinal  Patient location during procedure: OR Staffing Anesthesiologist: Lyndle Herrlich, MD Spinal Block Patient position: sitting Prep: DuraPrep Patient monitoring: heart rate, blood pressure and continuous pulse ox Approach: right paramedian Location: L3-4 Injection technique: single-shot Needle Needle type: Sprotte  Needle gauge: 24 G Needle length: 9 cm Assessment Sensory level: T4 Additional Notes Spinal Dosage in OR  .75% Bupivicaine ml       1.3 LLD x 3 min

## 2017-06-21 NOTE — Anesthesia Procedure Notes (Signed)
Date/Time: 06/21/2017 1:32 PM Performed by: Glory Buff, CRNA Oxygen Delivery Method: Simple face mask

## 2017-06-21 NOTE — Brief Op Note (Signed)
06/21/2017  2:46 PM  PATIENT:  Alison Lamb  76 y.o. female  PRE-OPERATIVE DIAGNOSIS:  Left knee medial meniscal tear and Lateral Meniscus tear. Chondro Malacia Left Knee.  POST-OPERATIVE DIAGNOSIS:  Same as Pre-Op  PROCEDURE:  Procedure(s): LEFT KNEE ARTHROSCOPY WITH MEDIAL MENISCAL DEBRIDEMENT (Left) Also, she had Partial Lateral Meniscectomy and Synovectomy of the Supra Patellar Pouch  SURGEON:  Surgeon(s) and Role:    Latanya Maudlin, MD - Primary    ASSISTANTS: OR Tech  ANESTHESIA:   spinal  EBL:  5cc  BLOOD ADMINISTERED:none  DRAINS: none   LOCAL MEDICATIONS USED:  MARCAINE  20cc of 0.50% with Epinephtine  SPECIMEN:  No Specimen  DISPOSITION OF SPECIMEN:  N/A  COUNTS:  YES  TOURNIQUET:  * No tourniquets in log *  DICTATION: .Other Dictation: Dictation Number [id number]  PLAN OF CARE: Discharge to home after PACU  PATIENT DISPOSITION:  PACU - hemodynamically stable.   Delay start of Pharmacological VTE agent (>24hrs) due to surgical blood loss or risk of bleeding: yes

## 2017-06-21 NOTE — Discharge Instructions (Signed)
Ambulate with walker,Full weight.  Take Aspirin 325mg m twice a day for two weeks. Call office for appointment in 12 Days. Remove dressings Sunday at noon and apply Bandaids each day and begin shower Sunday.   Spinal Anesthesia and Epidural Anesthesia, Care After These instructions give you information about caring for yourself after your procedure. Your doctor may also give you more specific instructions. Call your doctor if you have any problems or questions after your procedure. Follow these instructions at home: For at least 24 hours after the procedure:   Do not: ? Do activities where you could fall or get hurt (injured). ? Drive. ? Use heavy machinery. ? Drink alcohol. ? Take sleeping pills or medicines that make you sleepy (drowsy). ? Make important decisions. ? Sign legal documents. ? Take care of children on your own.  Rest. Eating and drinking  If you throw up (vomit), drink water, juice, or soup when you can drink without throwing up.  Make sure you do not feel like throwing up (are not nauseous) before you eat.  Follow the diet that is recommended by your doctor. General instructions  Have a responsible adult stay with you until you are awake and alert.  Take over-the-counter and prescription medicines only as told by your doctor.  If you smoke, do not smoke unless an adult is watching.  Keep all follow-up visits as told by your doctor. This is important. Contact a doctor if:  It has been more than one day since your procedure and you feel like throwing up.  It has been more than one day since your procedure and you throw up.  You have a rash. Get help right away if:  You have a fever.  You have a headache that lasts a long time.  You have a very bad headache.  Your vision is blurry.  You see two of a single object (double vision).  You are dizzy or light-headed.  You faint.  Your arms or legs tingle, feel weak, or get numb.  You have trouble  breathing.  You cannot pee (urinate). This information is not intended to replace advice given to you by your health care provider. Make sure you discuss any questions you have with your health care provider. Document Released: 10/03/2015 Document Revised: 02/02/2016 Document Reviewed: 10/03/2015 Elsevier Interactive Patient Education  [employer].

## 2017-06-21 NOTE — Brief Op Note (Signed)
06/21/2017  3:00 PM  PATIENT:  Alison Lamb  76 y.o. female  PRE-OPERATIVE DIAGNOSIS:  Left knee medial meniscal tear and Latera Meniscus Tear and Primary Osteoarthritis of Left Knee.  POST-OPERATIVE DIAGNOSIS:  Same as Pre-Op PROCEDURE:  Procedure(s): LEFT KNEE ARTHROSCOPY WITH MEDIAL MENISCAL DEBRIDEMENT (Left) and Lateral Meniscectomy and Synovectcmy of Supra patellar Pouch.  SURGEON:  Surgeon(s) and Role:    * Latanya Maudlin, MD - Primary    ASSISTANTS:OR Tech  ANESTHESIA:   spinal  EBL:  10 mL   BLOOD ADMINISTERED:none  DRAINS: none   LOCAL MEDICATIONS USED:  MARCAINE     SPECIMEN:  No Specimen  DISPOSITION OF SPECIMEN:  N/A  COUNTS:  YES  TOURNIQUET:  * No tourniquets in log *  DICTATION: .Other Dictation: Dictation Number [id number]  PLAN OF CARE: Discharge to home after PACU  PATIENT DISPOSITION:  PACU - hemodynamically stable.   Delay start of Pharmacological VTE agent (>24hrs) due to surgical blood loss or risk of bleeding: yes

## 2017-06-22 NOTE — Op Note (Signed)
NAMEARTIE, Alison Lamb           ACCOUNT NO.:  [id number]  MEDICAL RECORD NO.:  [id number]  LOCATION:                                 FACILITY:  PHYSICIAN:  Kipp Brood. Natasja Niday, M.D.DATE OF BIRTH:  February 06, 1941  DATE OF PROCEDURE:  06/21/2017 DATE OF DISCHARGE:                              OPERATIVE REPORT   SURGEON:  Kipp Brood. Gladstone Lighter, M.D.  ASSISTANT:  The OR nurse.  PREOPERATIVE DIAGNOSIS:  Torn medial and lateral meniscus and early primary osteoarthritis, left knee.  POSTOPERATIVE DIAGNOSIS:  Torn medial and lateral meniscus and early primary osteoarthritis, left knee.  OPERATION: 1. Diagnostic arthroscopy, left knee. 2. Partial medial meniscectomy, left knee. 3. Partial lateral meniscectomy, left knee. 4. Synovectomy of the suprapatellar pouch with abrasion chondroplasty     of the patella.  DESCRIPTION OF PROCEDURE:  Under spinal anesthesia, routine orthopedic prep and draping of the left lower extremity was carried out.  The left leg was placed in the knee holder.  Appropriate time-out was carried out, also marked the appropriate left leg in the holding area.  A small punctate incision was made in suprapatellar pouch.  Inflow cannula was inserted.  Knee was distended with saline.  Another small punctate incision was made in the anterior lateral joint.  The arthroscope was entered from lateral approach.  I did a complete diagnostic arthroscopy. I went up in the suprapatellar pouch.  I inserted the ArthroCare through the medial approach to the synovectomy and partial abrasion chondroplasty of patella.  I went down into the medial joint, did a partial medial meniscectomy, crossed over the midline.  The cruciates were intact.  I did a partial lateral meniscectomy.  The lateral meniscus was much worse than the medial meniscus.  She also had early primary osteoarthritic changes in both compartments, left greater than the right.  Thoroughly irrigated out the knee, removed all  fluid.  I injected 20 mL of 0.25% Marcaine into the knee.  Sterile Neosporin dressings were applied.  All 3 punctate incisions were closed with 3-0 nylon suture.  As I mentioned, a bundle dressing was applied.  She had 1 g of IV Ancef.  Postop, she will be on aspirin 325 mg b.i.d.  Also, she will be on Percocet 5/325 for pain.  The aspirin will be for anticoagulation.  She will need to be on that starting today twice a day and for 2 weeks.  She will be on crutches or walker partial weightbearing as tolerated.  I will see her in 10-12 days or prior to if there is a problem.         ______________________________ Kipp Brood Gladstone Lighter, M.D.    RAG/MEDQ  D:  06/21/2017  T:  06/22/2017  Job:  [id number]

## 2017-06-23 NOTE — Anesthesia Postprocedure Evaluation (Signed)
Anesthesia Post Note  Patient: Alison Lamb [facility]  Procedure(s) Performed: LEFT KNEE ARTHROSCOPY WITH MEDIAL MENISCAL DEBRIDEMENT (Left Knee)     Patient location during evaluation: PACU Anesthesia Type: Spinal Level of consciousness: awake Pain management: satisfactory to patient Vital Signs Assessment: post-procedure vital signs reviewed and stable Respiratory status: spontaneous breathing Cardiovascular status: blood pressure returned to baseline Postop Assessment: no headache and spinal receding Anesthetic complications: no    Last Vitals:  Vitals:   06/21/17 1556 06/21/17 1733  BP: (!) 144/59 (!) 144/76  Pulse:    Resp: 20 18  Temp: 36.5 C 36.9 C  SpO2: 99% 100%    Last Pain:  Vitals:   06/21/17 1733  TempSrc: Oral  PainSc:                  Riccardo Dubin

## 2017-06-23 NOTE — Anesthesia Preprocedure Evaluation (Signed)
Anesthesia Evaluation  Patient identified by MRN, date of birth, ID band Patient awake    Reviewed: Allergy & Precautions, H&P , Patient's Chart, lab work & pertinent test results  Airway Mallampati: II  TM Distance: >3 FB Neck ROM: full    Dental no notable dental hx.    Pulmonary    Pulmonary exam normal breath sounds clear to auscultation       Cardiovascular Exercise Tolerance: Good  Rhythm:regular Rate:Normal     Neuro/Psych    GI/Hepatic   Endo/Other    Renal/GU      Musculoskeletal   Abdominal   Peds  Hematology   Anesthesia Other Findings Hx and FHx of anesthesia difficulty w/GA Pt requests spinal anesthesia  Reproductive/Obstetrics                             Anesthesia Physical Anesthesia Plan  ASA: II  Anesthesia Plan: Spinal   Post-op Pain Management:    Induction:   PONV Risk Score and Plan: 1 and Treatment may vary due to age or medical condition and Ondansetron  Airway Management Planned:   Additional Equipment:   Intra-op Plan:   Post-operative Plan:   Informed Consent: I have reviewed the patients History and Physical, chart, labs and discussed the procedure including the risks, benefits and alternatives for the proposed anesthesia with the patient or authorized representative who has indicated his/her understanding and acceptance.     Plan Discussed with:   Anesthesia Plan Comments: (  )        Anesthesia Quick Evaluation

## 2017-06-24 ENCOUNTER — Encounter (HOSPITAL_COMMUNITY): Payer: Self-pay | Admitting: Orthopedic Surgery

## 2017-06-24 NOTE — Op Note (Signed)
NAME:  Alison Lamb, Alison Lamb                ACCOUNT NO.:  MEDICAL RECORD NO.:  [id number]  LOCATION:                                 FACILITY:  PHYSICIAN:  Anjelica Gorniak A. Shatarra Wehling, M.D.DATE OF BIRTH:  [birth year]  DATE OF PROCEDURE:  06/21/2017 DATE OF DISCHARGE:                              OPERATIVE REPORT   SURGEON:  Kipp Brood. Gladstone Lighter, M.D.  OPERATIVE ASSISTANT:  The OR tech.  PREOPERATIVE DIAGNOSES: 1. Partial tear of the medial meniscus. 2. Partial tear of lateral meniscus. 3. Early primary osteoarthritis of the left knee.  POSTOPERATIVE DIAGNOSES: 1. Partial tear of the medial meniscus. 2. Partial tear of lateral meniscus. 3. Early primary osteoarthritis of the left knee.  OPERATION: 1. Diagnostic arthroscopy, left knee. 2. Partial medial meniscectomy, left knee. 3. Partial lateral meniscectomy, left knee. 4. Synovectomy, suprapatellar pouch, left knee.  DESCRIPTION OF PROCEDURE:  Under spinal anesthesia, a routine orthopedic prep and draping of the left lower extremity was carried out. Appropriate time-out was carried out.  She also was given 1 g of IV Ancef.  At this time, a small punctate incision was made in the suprapatellar pouch.  Inflow cannula was inserted.  Knee was distended with saline.  Another small punctate incision was made in the lateral joint, and the arthroscope was entered from lateral approach.  At this time, I went up in the suprapatellar pouch.  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  I did a synovectomy and partial abrasion chondroplasty of patella.  Following that, I went down into the medial joint.  She had a small tear of the medial meniscus.  I did a partial medial meniscectomy.  Following that, I crossed over the midline and the cruciates were intact.  The lateral meniscus was rather significantly torn.  I did a partial lateral meniscectomy.  Both menisci then were probed, the remaining parts were intact.  The  other issue here was she had definite early osteoarthritis changes of the lateral and medial side, more on the lateral than medial. She also had definitely chondromalacia of the patella.  I thoroughly irrigated out the knee, removed all fluid, closed all 3 punctate incisions with 3-0 nylon suture.  I injected 20 mL of 0.25% Marcaine and epinephrine in the knee joint.  A sterile Neosporin bundle dressing was applied.  The patient left the operating room in satisfactory condition under spinal anesthesia.  She will start on aspirin 325 mg b.i.d. starting today and tonight again and also then breakfast and then dinner each day for at least 2 weeks as an anticoagulant.  She will ambulate with a walker or crutches, partial weightbearing as tolerated.  I will see her in the office in about a week or prior to if there is a problem.  She also will go home on Percocet 5/325 one every 4 hours p.r.n. for pain.          ______________________________ Kipp Brood. Gladstone Lighter, M.D.     RAG/MEDQ  D:  06/21/2017  T:  06/22/2017  Job:  [id number]

## 2017-07-01 DIAGNOSIS — Z9889 Other specified postprocedural states: Secondary | ICD-10-CM | POA: Insufficient documentation

## 2017-07-23 DIAGNOSIS — M25662 Stiffness of left knee, not elsewhere classified: Secondary | ICD-10-CM | POA: Diagnosis not present

## 2017-08-05 DIAGNOSIS — M25662 Stiffness of left knee, not elsewhere classified: Secondary | ICD-10-CM | POA: Diagnosis not present

## 2017-09-16 DIAGNOSIS — M7061 Trochanteric bursitis, right hip: Secondary | ICD-10-CM | POA: Diagnosis not present

## 2017-09-19 DIAGNOSIS — R69 Illness, unspecified: Secondary | ICD-10-CM | POA: Diagnosis not present

## 2017-10-31 DIAGNOSIS — R29898 Other symptoms and signs involving the musculoskeletal system: Secondary | ICD-10-CM | POA: Diagnosis not present

## 2017-10-31 DIAGNOSIS — N3001 Acute cystitis with hematuria: Secondary | ICD-10-CM | POA: Diagnosis not present

## 2017-10-31 DIAGNOSIS — R319 Hematuria, unspecified: Secondary | ICD-10-CM | POA: Diagnosis not present

## 2017-11-27 DIAGNOSIS — R319 Hematuria, unspecified: Secondary | ICD-10-CM | POA: Diagnosis not present

## 2017-12-14 DIAGNOSIS — J029 Acute pharyngitis, unspecified: Secondary | ICD-10-CM | POA: Diagnosis not present

## 2017-12-14 DIAGNOSIS — J020 Streptococcal pharyngitis: Secondary | ICD-10-CM | POA: Diagnosis not present

## 2018-01-03 DIAGNOSIS — H52223 Regular astigmatism, bilateral: Secondary | ICD-10-CM | POA: Diagnosis not present

## 2018-01-03 DIAGNOSIS — H25813 Combined forms of age-related cataract, bilateral: Secondary | ICD-10-CM | POA: Diagnosis not present

## 2018-01-03 DIAGNOSIS — H5203 Hypermetropia, bilateral: Secondary | ICD-10-CM | POA: Diagnosis not present

## 2018-01-04 DIAGNOSIS — Z0100 Encounter for examination of eyes and vision without abnormal findings: Secondary | ICD-10-CM | POA: Diagnosis not present

## 2018-01-27 DIAGNOSIS — K5792 Diverticulitis of intestine, part unspecified, without perforation or abscess without bleeding: Secondary | ICD-10-CM | POA: Diagnosis not present

## 2018-02-11 DIAGNOSIS — Z1231 Encounter for screening mammogram for malignant neoplasm of breast: Secondary | ICD-10-CM | POA: Diagnosis not present

## 2018-02-13 DIAGNOSIS — R922 Inconclusive mammogram: Secondary | ICD-10-CM | POA: Diagnosis not present

## 2018-05-08 DIAGNOSIS — E781 Pure hyperglyceridemia: Secondary | ICD-10-CM | POA: Diagnosis not present

## 2018-05-08 DIAGNOSIS — Z23 Encounter for immunization: Secondary | ICD-10-CM | POA: Diagnosis not present

## 2018-05-08 DIAGNOSIS — Z1389 Encounter for screening for other disorder: Secondary | ICD-10-CM | POA: Diagnosis not present

## 2018-05-08 DIAGNOSIS — Z0000 Encounter for general adult medical examination without abnormal findings: Secondary | ICD-10-CM | POA: Diagnosis not present

## 2018-05-28 DIAGNOSIS — R69 Illness, unspecified: Secondary | ICD-10-CM | POA: Diagnosis not present

## 2018-08-06 DIAGNOSIS — Z4689 Encounter for fitting and adjustment of other specified devices: Secondary | ICD-10-CM | POA: Diagnosis not present

## 2018-08-06 DIAGNOSIS — N819 Female genital prolapse, unspecified: Secondary | ICD-10-CM | POA: Diagnosis not present

## 2018-08-06 DIAGNOSIS — N952 Postmenopausal atrophic vaginitis: Secondary | ICD-10-CM | POA: Diagnosis not present

## 2018-08-11 DIAGNOSIS — R197 Diarrhea, unspecified: Secondary | ICD-10-CM | POA: Diagnosis not present

## 2018-08-11 DIAGNOSIS — R143 Flatulence: Secondary | ICD-10-CM | POA: Diagnosis not present

## 2018-08-15 DIAGNOSIS — R194 Change in bowel habit: Secondary | ICD-10-CM | POA: Diagnosis not present

## 2018-08-15 DIAGNOSIS — R197 Diarrhea, unspecified: Secondary | ICD-10-CM | POA: Diagnosis not present

## 2018-08-15 DIAGNOSIS — R159 Full incontinence of feces: Secondary | ICD-10-CM | POA: Diagnosis not present

## 2018-08-29 DIAGNOSIS — H00015 Hordeolum externum left lower eyelid: Secondary | ICD-10-CM | POA: Diagnosis not present

## 2018-08-29 DIAGNOSIS — L03211 Cellulitis of face: Secondary | ICD-10-CM | POA: Diagnosis not present

## 2018-10-30 DIAGNOSIS — N819 Female genital prolapse, unspecified: Secondary | ICD-10-CM | POA: Insufficient documentation

## 2018-11-04 DIAGNOSIS — N952 Postmenopausal atrophic vaginitis: Secondary | ICD-10-CM | POA: Diagnosis not present

## 2018-11-04 DIAGNOSIS — N819 Female genital prolapse, unspecified: Secondary | ICD-10-CM | POA: Diagnosis not present

## 2018-11-07 DIAGNOSIS — K648 Other hemorrhoids: Secondary | ICD-10-CM | POA: Diagnosis not present

## 2018-11-07 DIAGNOSIS — R159 Full incontinence of feces: Secondary | ICD-10-CM | POA: Diagnosis not present

## 2018-11-07 DIAGNOSIS — D125 Benign neoplasm of sigmoid colon: Secondary | ICD-10-CM | POA: Diagnosis not present

## 2018-11-07 DIAGNOSIS — K5730 Diverticulosis of large intestine without perforation or abscess without bleeding: Secondary | ICD-10-CM | POA: Diagnosis not present

## 2018-11-07 DIAGNOSIS — R197 Diarrhea, unspecified: Secondary | ICD-10-CM | POA: Diagnosis not present

## 2018-11-07 DIAGNOSIS — R194 Change in bowel habit: Secondary | ICD-10-CM | POA: Diagnosis not present

## 2018-11-12 DIAGNOSIS — D125 Benign neoplasm of sigmoid colon: Secondary | ICD-10-CM | POA: Diagnosis not present

## 2018-11-18 DIAGNOSIS — N952 Postmenopausal atrophic vaginitis: Secondary | ICD-10-CM | POA: Diagnosis not present

## 2018-11-18 DIAGNOSIS — N993 Prolapse of vaginal vault after hysterectomy: Secondary | ICD-10-CM | POA: Diagnosis not present

## 2018-11-27 DIAGNOSIS — R69 Illness, unspecified: Secondary | ICD-10-CM | POA: Diagnosis not present

## 2018-12-12 DIAGNOSIS — M542 Cervicalgia: Secondary | ICD-10-CM | POA: Diagnosis not present

## 2018-12-12 DIAGNOSIS — H9193 Unspecified hearing loss, bilateral: Secondary | ICD-10-CM | POA: Insufficient documentation

## 2018-12-12 DIAGNOSIS — H6123 Impacted cerumen, bilateral: Secondary | ICD-10-CM | POA: Diagnosis not present

## 2018-12-15 DIAGNOSIS — J029 Acute pharyngitis, unspecified: Secondary | ICD-10-CM | POA: Diagnosis not present

## 2019-02-05 DIAGNOSIS — N952 Postmenopausal atrophic vaginitis: Secondary | ICD-10-CM | POA: Diagnosis not present

## 2019-02-05 DIAGNOSIS — Z4689 Encounter for fitting and adjustment of other specified devices: Secondary | ICD-10-CM | POA: Diagnosis not present

## 2019-02-05 DIAGNOSIS — N819 Female genital prolapse, unspecified: Secondary | ICD-10-CM | POA: Diagnosis not present

## 2019-03-05 DIAGNOSIS — Z1231 Encounter for screening mammogram for malignant neoplasm of breast: Secondary | ICD-10-CM | POA: Diagnosis not present

## 2019-03-25 DIAGNOSIS — Z23 Encounter for immunization: Secondary | ICD-10-CM | POA: Diagnosis not present

## 2019-04-29 DIAGNOSIS — H25813 Combined forms of age-related cataract, bilateral: Secondary | ICD-10-CM | POA: Diagnosis not present

## 2019-04-29 DIAGNOSIS — H43399 Other vitreous opacities, unspecified eye: Secondary | ICD-10-CM | POA: Diagnosis not present

## 2019-04-29 DIAGNOSIS — H52223 Regular astigmatism, bilateral: Secondary | ICD-10-CM | POA: Diagnosis not present

## 2019-04-29 DIAGNOSIS — H40003 Preglaucoma, unspecified, bilateral: Secondary | ICD-10-CM | POA: Diagnosis not present

## 2019-04-29 DIAGNOSIS — H40053 Ocular hypertension, bilateral: Secondary | ICD-10-CM | POA: Diagnosis not present

## 2019-04-29 DIAGNOSIS — H43819 Vitreous degeneration, unspecified eye: Secondary | ICD-10-CM | POA: Diagnosis not present

## 2019-04-29 DIAGNOSIS — H524 Presbyopia: Secondary | ICD-10-CM | POA: Diagnosis not present

## 2019-04-29 DIAGNOSIS — H5203 Hypermetropia, bilateral: Secondary | ICD-10-CM | POA: Diagnosis not present

## 2019-04-30 DIAGNOSIS — Z0100 Encounter for examination of eyes and vision without abnormal findings: Secondary | ICD-10-CM | POA: Diagnosis not present

## 2019-05-08 DIAGNOSIS — Z466 Encounter for fitting and adjustment of urinary device: Secondary | ICD-10-CM | POA: Diagnosis not present

## 2019-05-08 DIAGNOSIS — N952 Postmenopausal atrophic vaginitis: Secondary | ICD-10-CM | POA: Diagnosis not present

## 2019-05-08 DIAGNOSIS — N819 Female genital prolapse, unspecified: Secondary | ICD-10-CM | POA: Diagnosis not present

## 2019-05-25 DIAGNOSIS — M8580 Other specified disorders of bone density and structure, unspecified site: Secondary | ICD-10-CM | POA: Diagnosis not present

## 2019-05-25 DIAGNOSIS — Z1389 Encounter for screening for other disorder: Secondary | ICD-10-CM | POA: Diagnosis not present

## 2019-05-25 DIAGNOSIS — E781 Pure hyperglyceridemia: Secondary | ICD-10-CM | POA: Diagnosis not present

## 2019-05-25 DIAGNOSIS — Z0000 Encounter for general adult medical examination without abnormal findings: Secondary | ICD-10-CM | POA: Diagnosis not present

## 2019-07-02 DIAGNOSIS — R69 Illness, unspecified: Secondary | ICD-10-CM | POA: Diagnosis not present

## 2019-07-22 ENCOUNTER — Ambulatory Visit: Payer: PPO

## 2019-07-31 ENCOUNTER — Ambulatory Visit: Payer: Medicare HMO | Attending: Internal Medicine

## 2019-07-31 DIAGNOSIS — Z23 Encounter for immunization: Secondary | ICD-10-CM | POA: Insufficient documentation

## 2019-07-31 NOTE — Progress Notes (Signed)
   Covid-19 Vaccination Clinic  Name:  [name]    MRN: [id number] DOB: June 08, 1941  07/31/2019  Alison Lamb was observed post Covid-19 immunization for 15 minutes without incidence. She was provided with Vaccine Information Sheet and instruction to access the V-Safe system.   [name] was instructed to call 911 with any severe reactions post vaccine: Marland Kitchen Difficulty breathing  . Swelling of your face and throat  . A fast heartbeat  . A bad rash all over your body  . Dizziness and weakness    Immunizations Administered    Name Date Dose VIS Date Route   Pfizer COVID-19 Vaccine 07/31/2019  8:47 AM 0.3 mL 06/05/2019 Intramuscular   Manufacturer: [place]   Lot: [id number]   [place]: [id number]

## 2019-08-25 ENCOUNTER — Ambulatory Visit: Payer: Medicare HMO | Attending: Internal Medicine

## 2019-08-25 DIAGNOSIS — Z23 Encounter for immunization: Secondary | ICD-10-CM | POA: Insufficient documentation

## 2019-08-25 NOTE — Progress Notes (Signed)
   Covid-19 Vaccination Clinic  Name:  Alison Lamb    MRN: [id number] DOB: [birth year]  08/25/2019  Alison Lamb was observed post Covid-19 immunization for 15 minutes without incident. She was provided with Vaccine Information Sheet and instruction to access the V-Safe system.   Alison Lamb was instructed to call 911 with any severe reactions post vaccine: Marland Kitchen Difficulty breathing  . Swelling of face and throat  . A fast heartbeat  . A bad rash all over body  . Dizziness and weakness   Immunizations Administered    Name Date Dose VIS Date Route   Pfizer COVID-19 Vaccine 08/25/2019 11:17 AM 0.3 mL 06/05/2019 Intramuscular   Manufacturer: [place]   Lot: [id number]   [place]: [id number]

## 2019-11-03 DIAGNOSIS — H2513 Age-related nuclear cataract, bilateral: Secondary | ICD-10-CM | POA: Diagnosis not present

## 2019-11-03 DIAGNOSIS — H25813 Combined forms of age-related cataract, bilateral: Secondary | ICD-10-CM | POA: Diagnosis not present

## 2019-11-05 DIAGNOSIS — N819 Female genital prolapse, unspecified: Secondary | ICD-10-CM | POA: Diagnosis not present

## 2019-11-05 DIAGNOSIS — Z4689 Encounter for fitting and adjustment of other specified devices: Secondary | ICD-10-CM | POA: Diagnosis not present

## 2019-11-05 DIAGNOSIS — N952 Postmenopausal atrophic vaginitis: Secondary | ICD-10-CM | POA: Diagnosis not present

## 2019-12-15 DIAGNOSIS — Z0389 Encounter for observation for other suspected diseases and conditions ruled out: Secondary | ICD-10-CM | POA: Diagnosis not present

## 2019-12-15 DIAGNOSIS — Z780 Asymptomatic menopausal state: Secondary | ICD-10-CM | POA: Diagnosis not present

## 2020-04-01 DIAGNOSIS — R69 Illness, unspecified: Secondary | ICD-10-CM | POA: Diagnosis not present

## 2020-04-06 DIAGNOSIS — R69 Illness, unspecified: Secondary | ICD-10-CM | POA: Diagnosis not present

## 2020-05-31 DIAGNOSIS — N952 Postmenopausal atrophic vaginitis: Secondary | ICD-10-CM | POA: Diagnosis not present

## 2020-05-31 DIAGNOSIS — N819 Female genital prolapse, unspecified: Secondary | ICD-10-CM | POA: Diagnosis not present

## 2020-05-31 DIAGNOSIS — Z4689 Encounter for fitting and adjustment of other specified devices: Secondary | ICD-10-CM | POA: Diagnosis not present

## 2020-06-01 DIAGNOSIS — E781 Pure hyperglyceridemia: Secondary | ICD-10-CM | POA: Diagnosis not present

## 2020-06-01 DIAGNOSIS — Z8679 Personal history of other diseases of the circulatory system: Secondary | ICD-10-CM | POA: Diagnosis not present

## 2020-06-01 DIAGNOSIS — Z0000 Encounter for general adult medical examination without abnormal findings: Secondary | ICD-10-CM | POA: Diagnosis not present

## 2020-06-01 DIAGNOSIS — Z23 Encounter for immunization: Secondary | ICD-10-CM | POA: Diagnosis not present

## 2020-06-01 DIAGNOSIS — Z1159 Encounter for screening for other viral diseases: Secondary | ICD-10-CM | POA: Diagnosis not present

## 2020-06-01 DIAGNOSIS — Z1389 Encounter for screening for other disorder: Secondary | ICD-10-CM | POA: Diagnosis not present

## 2020-06-02 DIAGNOSIS — H5203 Hypermetropia, bilateral: Secondary | ICD-10-CM | POA: Diagnosis not present

## 2020-07-07 DIAGNOSIS — C4441 Basal cell carcinoma of skin of scalp and neck: Secondary | ICD-10-CM | POA: Diagnosis not present

## 2020-07-07 DIAGNOSIS — D485 Neoplasm of uncertain behavior of skin: Secondary | ICD-10-CM | POA: Diagnosis not present

## 2020-07-18 DIAGNOSIS — C4441 Basal cell carcinoma of skin of scalp and neck: Secondary | ICD-10-CM | POA: Diagnosis not present

## 2020-08-05 DIAGNOSIS — H25043 Posterior subcapsular polar age-related cataract, bilateral: Secondary | ICD-10-CM | POA: Diagnosis not present

## 2020-08-05 DIAGNOSIS — H25013 Cortical age-related cataract, bilateral: Secondary | ICD-10-CM | POA: Diagnosis not present

## 2020-08-05 DIAGNOSIS — H2513 Age-related nuclear cataract, bilateral: Secondary | ICD-10-CM | POA: Diagnosis not present

## 2020-08-05 DIAGNOSIS — H18413 Arcus senilis, bilateral: Secondary | ICD-10-CM | POA: Diagnosis not present

## 2020-08-05 DIAGNOSIS — H2511 Age-related nuclear cataract, right eye: Secondary | ICD-10-CM | POA: Diagnosis not present

## 2020-10-17 DIAGNOSIS — H52221 Regular astigmatism, right eye: Secondary | ICD-10-CM | POA: Diagnosis not present

## 2020-10-17 DIAGNOSIS — H52201 Unspecified astigmatism, right eye: Secondary | ICD-10-CM | POA: Diagnosis not present

## 2020-10-17 DIAGNOSIS — Z9841 Cataract extraction status, right eye: Secondary | ICD-10-CM | POA: Diagnosis not present

## 2020-10-17 DIAGNOSIS — H2511 Age-related nuclear cataract, right eye: Secondary | ICD-10-CM | POA: Diagnosis not present

## 2020-10-17 DIAGNOSIS — Z961 Presence of intraocular lens: Secondary | ICD-10-CM | POA: Diagnosis not present

## 2020-10-18 DIAGNOSIS — H2512 Age-related nuclear cataract, left eye: Secondary | ICD-10-CM | POA: Diagnosis not present

## 2020-11-02 DIAGNOSIS — Z1231 Encounter for screening mammogram for malignant neoplasm of breast: Secondary | ICD-10-CM | POA: Diagnosis not present

## 2020-11-28 DIAGNOSIS — Z4689 Encounter for fitting and adjustment of other specified devices: Secondary | ICD-10-CM | POA: Diagnosis not present

## 2020-11-28 DIAGNOSIS — N952 Postmenopausal atrophic vaginitis: Secondary | ICD-10-CM | POA: Diagnosis not present

## 2020-11-28 DIAGNOSIS — N819 Female genital prolapse, unspecified: Secondary | ICD-10-CM | POA: Diagnosis not present

## 2020-12-12 DIAGNOSIS — H2512 Age-related nuclear cataract, left eye: Secondary | ICD-10-CM | POA: Diagnosis not present

## 2020-12-12 DIAGNOSIS — H52202 Unspecified astigmatism, left eye: Secondary | ICD-10-CM | POA: Diagnosis not present

## 2021-01-04 DIAGNOSIS — Z0100 Encounter for examination of eyes and vision without abnormal findings: Secondary | ICD-10-CM | POA: Diagnosis not present

## 2021-03-06 DIAGNOSIS — Z974 Presence of external hearing-aid: Secondary | ICD-10-CM | POA: Diagnosis not present

## 2021-03-06 DIAGNOSIS — H6123 Impacted cerumen, bilateral: Secondary | ICD-10-CM | POA: Diagnosis not present

## 2021-03-06 DIAGNOSIS — H903 Sensorineural hearing loss, bilateral: Secondary | ICD-10-CM | POA: Diagnosis not present

## 2021-05-29 DIAGNOSIS — N819 Female genital prolapse, unspecified: Secondary | ICD-10-CM | POA: Diagnosis not present

## 2021-05-29 DIAGNOSIS — N952 Postmenopausal atrophic vaginitis: Secondary | ICD-10-CM | POA: Diagnosis not present

## 2021-05-29 DIAGNOSIS — M8580 Other specified disorders of bone density and structure, unspecified site: Secondary | ICD-10-CM | POA: Insufficient documentation

## 2021-05-29 DIAGNOSIS — R3911 Hesitancy of micturition: Secondary | ICD-10-CM | POA: Diagnosis not present

## 2021-05-29 DIAGNOSIS — Z4689 Encounter for fitting and adjustment of other specified devices: Secondary | ICD-10-CM | POA: Diagnosis not present

## 2021-05-29 DIAGNOSIS — Z87448 Personal history of other diseases of urinary system: Secondary | ICD-10-CM | POA: Diagnosis not present

## 2021-06-08 DIAGNOSIS — E781 Pure hyperglyceridemia: Secondary | ICD-10-CM | POA: Diagnosis not present

## 2021-06-08 DIAGNOSIS — L089 Local infection of the skin and subcutaneous tissue, unspecified: Secondary | ICD-10-CM | POA: Diagnosis not present

## 2021-06-08 DIAGNOSIS — Z1331 Encounter for screening for depression: Secondary | ICD-10-CM | POA: Diagnosis not present

## 2021-06-08 DIAGNOSIS — Z1389 Encounter for screening for other disorder: Secondary | ICD-10-CM | POA: Diagnosis not present

## 2021-06-08 DIAGNOSIS — Z0000 Encounter for general adult medical examination without abnormal findings: Secondary | ICD-10-CM | POA: Diagnosis not present

## 2021-09-14 DIAGNOSIS — D2361 Other benign neoplasm of skin of right upper limb, including shoulder: Secondary | ICD-10-CM | POA: Diagnosis not present

## 2021-09-14 DIAGNOSIS — D485 Neoplasm of uncertain behavior of skin: Secondary | ICD-10-CM | POA: Diagnosis not present

## 2021-10-18 DIAGNOSIS — R399 Unspecified symptoms and signs involving the genitourinary system: Secondary | ICD-10-CM | POA: Diagnosis not present

## 2021-11-08 DIAGNOSIS — Z1231 Encounter for screening mammogram for malignant neoplasm of breast: Secondary | ICD-10-CM | POA: Diagnosis not present

## 2021-11-27 DIAGNOSIS — I10 Essential (primary) hypertension: Secondary | ICD-10-CM | POA: Insufficient documentation

## 2021-11-27 DIAGNOSIS — Z4689 Encounter for fitting and adjustment of other specified devices: Secondary | ICD-10-CM | POA: Diagnosis not present

## 2021-11-27 DIAGNOSIS — N812 Incomplete uterovaginal prolapse: Secondary | ICD-10-CM | POA: Diagnosis not present

## 2021-11-27 DIAGNOSIS — E781 Pure hyperglyceridemia: Secondary | ICD-10-CM | POA: Insufficient documentation

## 2021-11-27 DIAGNOSIS — K5730 Diverticulosis of large intestine without perforation or abscess without bleeding: Secondary | ICD-10-CM | POA: Insufficient documentation

## 2021-11-27 DIAGNOSIS — N952 Postmenopausal atrophic vaginitis: Secondary | ICD-10-CM | POA: Diagnosis not present

## 2021-11-28 DIAGNOSIS — C44319 Basal cell carcinoma of skin of other parts of face: Secondary | ICD-10-CM | POA: Diagnosis not present

## 2021-11-28 DIAGNOSIS — L820 Inflamed seborrheic keratosis: Secondary | ICD-10-CM | POA: Diagnosis not present

## 2021-11-28 DIAGNOSIS — Z85828 Personal history of other malignant neoplasm of skin: Secondary | ICD-10-CM | POA: Diagnosis not present

## 2022-01-09 DIAGNOSIS — L820 Inflamed seborrheic keratosis: Secondary | ICD-10-CM | POA: Diagnosis not present

## 2022-01-09 DIAGNOSIS — D485 Neoplasm of uncertain behavior of skin: Secondary | ICD-10-CM | POA: Diagnosis not present

## 2022-01-16 DIAGNOSIS — C44319 Basal cell carcinoma of skin of other parts of face: Secondary | ICD-10-CM | POA: Diagnosis not present

## 2022-03-05 DIAGNOSIS — N952 Postmenopausal atrophic vaginitis: Secondary | ICD-10-CM | POA: Diagnosis not present

## 2022-03-05 DIAGNOSIS — Z4689 Encounter for fitting and adjustment of other specified devices: Secondary | ICD-10-CM | POA: Diagnosis not present

## 2022-03-05 DIAGNOSIS — N819 Female genital prolapse, unspecified: Secondary | ICD-10-CM | POA: Diagnosis not present

## 2022-04-19 DIAGNOSIS — Z23 Encounter for immunization: Secondary | ICD-10-CM | POA: Diagnosis not present

## 2022-07-31 DIAGNOSIS — Z0100 Encounter for examination of eyes and vision without abnormal findings: Secondary | ICD-10-CM | POA: Diagnosis not present

## 2022-08-28 DIAGNOSIS — B338 Other specified viral diseases: Secondary | ICD-10-CM | POA: Diagnosis not present

## 2022-09-04 DIAGNOSIS — Z0000 Encounter for general adult medical examination without abnormal findings: Secondary | ICD-10-CM | POA: Diagnosis not present

## 2022-09-04 DIAGNOSIS — Z1331 Encounter for screening for depression: Secondary | ICD-10-CM | POA: Diagnosis not present

## 2022-09-04 DIAGNOSIS — E781 Pure hyperglyceridemia: Secondary | ICD-10-CM | POA: Diagnosis not present

## 2022-09-04 DIAGNOSIS — M8580 Other specified disorders of bone density and structure, unspecified site: Secondary | ICD-10-CM | POA: Diagnosis not present

## 2022-09-04 DIAGNOSIS — R030 Elevated blood-pressure reading, without diagnosis of hypertension: Secondary | ICD-10-CM | POA: Diagnosis not present

## 2022-10-18 DIAGNOSIS — N993 Prolapse of vaginal vault after hysterectomy: Secondary | ICD-10-CM | POA: Diagnosis not present

## 2022-10-18 DIAGNOSIS — N958 Other specified menopausal and perimenopausal disorders: Secondary | ICD-10-CM | POA: Diagnosis not present

## 2022-11-07 ENCOUNTER — Other Ambulatory Visit (HOSPITAL_COMMUNITY): Payer: Self-pay | Admitting: Orthopedic Surgery

## 2022-11-07 ENCOUNTER — Ambulatory Visit (HOSPITAL_COMMUNITY)
Admission: RE | Admit: 2022-11-07 | Discharge: 2022-11-07 | Disposition: A | Discharge status: Home / Self Care | Payer: Medicare HMO | Source: Ambulatory Visit | Attending: Cardiovascular Disease | Admitting: Cardiovascular Disease

## 2022-11-07 DIAGNOSIS — M79604 Pain in right leg: Secondary | ICD-10-CM

## 2022-11-07 DIAGNOSIS — M79605 Pain in left leg: Secondary | ICD-10-CM | POA: Diagnosis not present

## 2022-11-16 DIAGNOSIS — N950 Postmenopausal bleeding: Secondary | ICD-10-CM | POA: Diagnosis not present

## 2022-11-22 DIAGNOSIS — N812 Incomplete uterovaginal prolapse: Secondary | ICD-10-CM | POA: Diagnosis not present

## 2022-11-22 DIAGNOSIS — N840 Polyp of corpus uteri: Secondary | ICD-10-CM | POA: Diagnosis not present

## 2022-11-22 DIAGNOSIS — N950 Postmenopausal bleeding: Secondary | ICD-10-CM | POA: Diagnosis not present

## 2022-11-27 DIAGNOSIS — M545 Low back pain, unspecified: Secondary | ICD-10-CM | POA: Diagnosis not present

## 2022-12-26 DIAGNOSIS — Z1231 Encounter for screening mammogram for malignant neoplasm of breast: Secondary | ICD-10-CM | POA: Diagnosis not present

## 2022-12-26 DIAGNOSIS — N958 Other specified menopausal and perimenopausal disorders: Secondary | ICD-10-CM | POA: Diagnosis not present

## 2022-12-26 DIAGNOSIS — M8588 Other specified disorders of bone density and structure, other site: Secondary | ICD-10-CM | POA: Diagnosis not present

## 2023-01-07 DIAGNOSIS — I10 Essential (primary) hypertension: Secondary | ICD-10-CM | POA: Diagnosis not present

## 2023-01-07 DIAGNOSIS — N841 Polyp of cervix uteri: Secondary | ICD-10-CM | POA: Diagnosis not present

## 2023-01-07 DIAGNOSIS — N840 Polyp of corpus uteri: Secondary | ICD-10-CM | POA: Diagnosis not present

## 2023-01-07 DIAGNOSIS — N812 Incomplete uterovaginal prolapse: Secondary | ICD-10-CM | POA: Diagnosis not present

## 2023-01-07 DIAGNOSIS — N950 Postmenopausal bleeding: Secondary | ICD-10-CM | POA: Diagnosis not present

## 2023-01-09 DIAGNOSIS — C44519 Basal cell carcinoma of skin of other part of trunk: Secondary | ICD-10-CM | POA: Diagnosis not present

## 2023-01-21 DIAGNOSIS — H00014 Hordeolum externum left upper eyelid: Secondary | ICD-10-CM | POA: Diagnosis not present

## 2023-02-07 DIAGNOSIS — L821 Other seborrheic keratosis: Secondary | ICD-10-CM | POA: Diagnosis not present

## 2023-02-07 DIAGNOSIS — D1801 Hemangioma of skin and subcutaneous tissue: Secondary | ICD-10-CM | POA: Diagnosis not present

## 2023-02-07 DIAGNOSIS — L570 Actinic keratosis: Secondary | ICD-10-CM | POA: Diagnosis not present

## 2023-02-07 DIAGNOSIS — Z85828 Personal history of other malignant neoplasm of skin: Secondary | ICD-10-CM | POA: Diagnosis not present

## 2023-02-21 DIAGNOSIS — N812 Incomplete uterovaginal prolapse: Secondary | ICD-10-CM | POA: Diagnosis not present

## 2023-02-21 DIAGNOSIS — Z4689 Encounter for fitting and adjustment of other specified devices: Secondary | ICD-10-CM | POA: Diagnosis not present

## 2023-02-21 DIAGNOSIS — R8290 Unspecified abnormal findings in urine: Secondary | ICD-10-CM | POA: Diagnosis not present

## 2023-03-18 DIAGNOSIS — H6060 Unspecified chronic otitis externa, unspecified ear: Secondary | ICD-10-CM | POA: Diagnosis not present

## 2023-03-18 DIAGNOSIS — H6120 Impacted cerumen, unspecified ear: Secondary | ICD-10-CM | POA: Diagnosis not present

## 2023-03-18 DIAGNOSIS — H903 Sensorineural hearing loss, bilateral: Secondary | ICD-10-CM | POA: Diagnosis not present

## 2023-07-04 NOTE — Progress Notes (Signed)
 I, Leotis Batter, CMA acting as a scribe for Artist Lloyd, MD.  Alison Lamb is a 83 y.o. female who presents to [facility] at [facility] today for back pain x 3 weeks. Pt locates pain to right lower back, radiating into the right leg and gluteal region. Denies groin pain.  U provides some relief. Has been stretching.   Radiating pain: R LE LE numbness/tingling: no, intermittently LE weakness: yes Aggravates: ambulation Treatments tried: IBU, heat  Pertinent review of systems: No fevers or chills  Relevant historical information: Hypertension   Exam:  BP (!) 160/102   Pulse 85   Ht 5' 6 (1.676 m)   Wt 165 lb (74.8 kg)   SpO2 100%   BMI 26.63 kg/m  General: Well Developed, well nourished, and in no acute distress.   MSK: L-spine: Normal appearing Nontender palpation spinal midline. Decreased lumbar motion. Lower extremity strength is intact except noted below. Reflexes are intact.  Right hip normal-appearing nontender normal hip motion reduced strength hip abduction and external rotation.    Lab and Radiology Results  X-ray images lumbar spine and right hip obtained today personally and independently interpreted.  Lumbar spine: Mild multilevel degeneration.  No acute fractures are visible.  Right hip: No acute fractures.  No severe degenerative changes.  Await formal radiology review   Assessment and Plan: 83 y.o. female with right buttocks pain radiating down the right leg in a posterior manner consistent with S1 dermatomal pattern.  Differential includes lumbosacral radiculopathy and hip abductor tendinopathy.  Plan for trial of physical therapy.  Additionally I have prescribed prednisone  and gabapentin  that she could take now or take if needed in the future.  Plan to recheck in about 8 weeks.   PDMP not reviewed this encounter. Orders Placed This Encounter  Procedures   DG Lumbar Spine 2-3 Views    Standing Status:   Future     Number of Occurrences:   1    Expiration Date:   08/05/2023    Reason for Exam (SYMPTOM  OR DIAGNOSIS REQUIRED):   low back pain    Preferred imaging location?:   [facility]   DG HIP UNILAT W OR W/O PELVIS 2-3 VIEWS RIGHT    Standing Status:   Future    Number of Occurrences:   1    Expiration Date:   08/05/2023    Reason for Exam (SYMPTOM  OR DIAGNOSIS REQUIRED):   lumbar radiculoapthy    Preferred imaging location?:   [place] [facility]   Ambulatory referral to Physical Therapy    Referral Priority:   Routine    Referral Type:   Physical Medicine    Referral Reason:   Specialty Services Required    Requested Specialty:   Physical Therapy    Number of Visits Requested:   1   Meds ordered this encounter  Medications   gabapentin  (NEURONTIN ) 300 MG capsule    Sig: Take 1 capsule (300 mg total) by mouth 3 (three) times daily as needed.    Dispense:  90 capsule    Refill:  1   predniSONE  (DELTASONE ) 50 MG tablet    Sig: Take 1 pill daily for 5 days    Dispense:  5 tablet    Refill:  0     Discussed warning signs or symptoms. Please see discharge instructions. Patient expresses understanding.   The above documentation has been reviewed and is accurate and complete Artist Lloyd, M.D.

## 2023-07-05 ENCOUNTER — Encounter: Payer: Self-pay | Admitting: Family Medicine

## 2023-07-05 ENCOUNTER — Ambulatory Visit (INDEPENDENT_AMBULATORY_CARE_PROVIDER_SITE_OTHER): Payer: Medicare HMO

## 2023-07-05 ENCOUNTER — Ambulatory Visit (INDEPENDENT_AMBULATORY_CARE_PROVIDER_SITE_OTHER): Payer: Medicare HMO | Admitting: Family Medicine

## 2023-07-05 VITALS — BP 160/102 | HR 85 | Ht 66.0 in | Wt 165.0 lb

## 2023-07-05 DIAGNOSIS — M5441 Lumbago with sciatica, right side: Secondary | ICD-10-CM | POA: Diagnosis not present

## 2023-07-05 DIAGNOSIS — M439 Deforming dorsopathy, unspecified: Secondary | ICD-10-CM | POA: Diagnosis not present

## 2023-07-05 DIAGNOSIS — M47816 Spondylosis without myelopathy or radiculopathy, lumbar region: Secondary | ICD-10-CM | POA: Diagnosis not present

## 2023-07-05 DIAGNOSIS — M160 Bilateral primary osteoarthritis of hip: Secondary | ICD-10-CM | POA: Diagnosis not present

## 2023-07-05 DIAGNOSIS — M25551 Pain in right hip: Secondary | ICD-10-CM | POA: Diagnosis not present

## 2023-07-05 DIAGNOSIS — G8929 Other chronic pain: Secondary | ICD-10-CM

## 2023-07-05 DIAGNOSIS — I878 Other specified disorders of veins: Secondary | ICD-10-CM | POA: Diagnosis not present

## 2023-07-05 DIAGNOSIS — M545 Low back pain, unspecified: Secondary | ICD-10-CM | POA: Diagnosis not present

## 2023-07-05 MED ORDER — PREDNISONE 50 MG PO TABS: ORAL_TABLET | ORAL | 0 refills | Status: DC

## 2023-07-05 MED ORDER — GABAPENTIN 300 MG PO CAPS: 300.0000 mg | ORAL_CAPSULE | Freq: Three times a day (TID) | ORAL | 1 refills | Status: DC | PRN

## 2023-07-05 NOTE — Patient Instructions (Addendum)
Thank you for coming in today.   Please get an Xray today before you leave   I've referred you to Physical Therapy.  Let us know if you don't hear from them in one week.  I've sent a prescription for Gabapentin & Prednisone to your pharmacy.   Check back in 8 weeks

## 2023-07-09 NOTE — Progress Notes (Signed)
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

## 2023-07-09 NOTE — Progress Notes (Signed)
 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

## 2023-07-19 ENCOUNTER — Telehealth: Payer: Self-pay | Admitting: Family Medicine

## 2023-07-19 NOTE — Telephone Encounter (Signed)
You could take those 400 mg of ibuprofen up to every 8 hours if needed.  Additionally when I saw you last I prescribed prednisone.  Did you finish taking it and did it help at least temporarily?  If this plan is line is not working and your symptoms are still worse we could try moving directly to an MRI for injection planning.

## 2023-07-19 NOTE — Telephone Encounter (Signed)
Called patient back. She said that she did take the prednisone, the first two days were better, but after that the pain was the same.  She will begin taking Ibuprofen more often.  At this time, she would like to hold off on the MRI.

## 2023-07-19 NOTE — Telephone Encounter (Signed)
Patient called stating that she is having worsening pain especially when sitting or lying down. She is not scheduled to start PT until February 4th.  She said that she is currently taking 400mg  of Ibuprofen once a day. That seems to help her pain but wears off quickly. She asked what Dr Denyse Amass would recommend and how much medication she can take?  Please advise.

## 2023-07-22 NOTE — Telephone Encounter (Signed)
Spoke to pt and provided Dr. Zollie Pee advise. She notes she has been feeling pretty good. Pt reports bilat feet swelling, but has increase activity. Advised she could do elevation and compression. She asked about rx and was advised that may be advisable in the future.

## 2023-07-30 ENCOUNTER — Ambulatory Visit: Payer: Medicare HMO | Admitting: Physical Therapy

## 2023-07-30 ENCOUNTER — Encounter: Payer: Self-pay | Admitting: Physical Therapy

## 2023-07-30 DIAGNOSIS — M5416 Radiculopathy, lumbar region: Secondary | ICD-10-CM

## 2023-07-30 DIAGNOSIS — I10 Essential (primary) hypertension: Secondary | ICD-10-CM | POA: Diagnosis not present

## 2023-07-30 DIAGNOSIS — R600 Localized edema: Secondary | ICD-10-CM | POA: Diagnosis not present

## 2023-07-30 NOTE — Therapy (Signed)
 OUTPATIENT PHYSICAL THERAPY LOWER EXTREMITY EVALUATION   Patient Name: Alison Lamb MRN: [id number] DOB:June 04, 1941, 83 y.o., female Today's Date: 07/30/2023  END OF SESSION:  PT End of Session - 07/30/23 1039     Visit Number 1    Number of Visits 16    Date for PT Re-Evaluation 09/24/23    Authorization Type [employer]    PT Start Time 0935    PT Stop Time 1035    PT Time Calculation (min) 60 min    Activity Tolerance Patient tolerated treatment well    Behavior During Therapy WFL for tasks assessed/performed             Past Medical History:  Diagnosis Date   Complication of anesthesia 20 YRS AGO   SLOW TO AWAKEN AFTER CHOLECYSTECTOMY   Family history of adverse reaction to anesthesia 1981   FATHER DIED DURING COLON SURGERY DID NOT WAKE UP AFTER SURGERY FEW DETAILS GIVEN TO PATIENT    Family history of anesthesia complication 03/2017   SISTER WITH PAIN AFTER GALLBLADDER SURGERY, YOUNGER SISTER TROUBLE TROUBLE WAKING UP AFTER KNEE SURGERY IN [place] 2018   Past Surgical History:  Procedure Laterality Date   BASAL CELL CARCINOMA EXCISION     SEVERAL REMOVED   CHOLECYSTECTOMY  2000   COLONSCOPY  EVERY 5 YEARS   DIAGNOSTIC LAPAROSCOPY  1984   FOR OVARY CYST   FOOT SURGERY Right 1990'S   BONE SURGERY   KNEE ARTHROSCOPY WITH MEDIAL MENISECTOMY Left 06/21/2017   Procedure: LEFT KNEE ARTHROSCOPY WITH MEDIAL MENISCAL DEBRIDEMENT;  Surgeon: Heide Ingle, MD;  Location: [facility];  Service: Orthopedics;  Laterality: Left;   MOES PROCEDURE     X 2   MULTIPLE TOOTH EXTRACTIONS     TONSILLECTOMY  AGE 48   TUBAL LIGATION  1974   Patient Active Problem List   Diagnosis Date Noted   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 States minimal low back pain.  Previous pain in May- same but only for 2 weeks and then subsided. Now has been hurting since December.  Activities:  Normally active and goes to [employer]. Noted swelling in bil L>R ankles and feet today.  Pt states most Pain in back of glute and back of thigh, into ankle, not into foot, no numb or tingling.  States comfortable in sitting/no pain, but does have pain with Transitions of sitting/standing.  She has been mostly standing, reports about 12 hrs day. Because she has pain with these transition motions. Standing with Most weight on L LE, offloading R LE. She does  have a cane that she has been using, but did not bring today. She is limping with pain upon walking into clinic today.    PERTINENT HISTORY: N/A  PAIN:  Are you having pain? Yes: NPRS scale: 4- 8 /10 Pain location: R leg pain Pain description: pain down leg, radiating Aggravating factors: transitions from sitting to standing and walking Relieving factors: none stated, standing with offloading  of R LE.   PRECAUTIONS: None  WEIGHT BEARING RESTRICTIONS: No  FALLS:  Has patient fallen in last 6 months? No   PLOF: Independent   PATIENT GOALS:  Decreased pain in R leg, return to regular activities.   NEXT MD VISIT:   OBJECTIVE:   DIAGNOSTIC FINDINGS:   PATIENT SURVEYS:  Next visit    COGNITION: Overall cognitive status: Within functional limits for tasks assessed     SENSATION: WFL  EDEMA:   Mild in R lower leg/foot, moderate in L lower leg/foot -ongoing for about 2 weeks per pt. Has not seen pcp for this.   POSTURE:    No Significant postural limitations  PALPATION: no pain to  palpate today in low back, glute, or hip.    LOWER EXTREMITY ROM: Lumbar: flexion: mild limitation,   Ext: test next visit.  Hips: WFL, mild limitation for R ER.   LOWER EXTREMITY MMT:  MMT Left eval Right  eval  Hip flexion  4 (pain)  Hip extension    Hip abduction  4  Hip adduction    Hip internal rotation    Hip external rotation  4  Knee flexion    Knee extension    Ankle dorsiflexion    Ankle plantarflexion    Ankle inversion    Ankle eversion     (Blank rows = not tested)  LOWER EXTREMITY SPECIAL TESTS:  Neg SLR   FUNCTIONAL TESTS:  Pain with standing/loading of R LE. Immediate pain with standing up from seated position.   GAIT: Distance walked: 50 ft Assistive device utilized: Single point cane Level of assistance: Modified independence Comments: antalgic, decreased weight acceptance on R due to pain, short step length.  Tried walking (out of clinic) with RW, much improved stride length, and pain x 50 ft    TODAY'S TREATMENT:                                                                                                                              DATE:   07/30/2023  Therapeutic Exercise: Aerobic: Supine: pelvic tilts x2 x 10;  SKTC 30 sec x 3 bil;   Piriformis, mod fig 4, 20 sec x 3 bil; Seated HSS 20 sec x 3 on R;   Seated: Standing: Stretches:  Neuromuscular Re-education: Manual Therapy:  manual lumbar distraction  Therapeutic Activity: Self Care: on current presentation, ways to offload back, sleeping positions, need to sit or lay down to rest vs stand all day, need to get appt with PCP today for swelling in bil feet.  Gait: education and practice for walking with SPC, x 45 ft , cane in L hand, cuing for equal stride length and sequencing with SPC. Also tried RW out of clinic, with improved offloading and pain relief x 50 ft     PATIENT EDUCATION:  Education details: PT POC, Exam findings, HEP Person educated: Patient Education method:  Explanation, Demonstration, Tactile cues, Verbal cues,  and Handouts Education comprehension: verbalized understanding, returned demonstration, verbal cues required, tactile cues required, and needs further education   HOME EXERCISE PROGRAM: Access Code: [email or web address] URL: [email or web address] Date: 07/30/2023 Prepared by: Tinnie Don  Exercises - Supine Posterior Pelvic Tilt  - 2 x daily - 1-2 sets - 10 reps - Hooklying Single Knee to Chest Stretch  - 2-3 x daily - 3 reps - 30 hold - Supine Piriformis Stretch Pulling Heel to Hip  - 2-3 x daily - 3 reps - 20 hold - Supine Diaphragmatic Breathing  - Seated Ankle Pumps on Table  - 3 x daily - 1-2 sets - 10 reps - Seated Hamstring Stretch  - 2 x daily - 3 reps - 30 hold  ASSESSMENT:  CLINICAL IMPRESSION: Patient presents with primary complaint of  pain in R leg. She has radiating pain in R leg ongoing since December. She has increased swelling in both feet and lower legs today. Recommended she call PCP today. At end of session, pt states that she has been standing for about 12 hrs/day to try to keep back less painful, so it is likely that feet/legs are swollen from this change of activity, but will send to PCP to make sure. Pt with symptoms of sciatic pain in R LE. She has no pain in seated or hooklying positions today or with light ther ex performed. Discussed trying to sit more often at home for pressure relief.  Pt with significant pain with standing/loading of R LE. Educated on use of SPC or walker for pressure relief as needed. Symptoms consistent with lumbar radiculopathy. Pt with decreased ability for full functional activities due to significant pain. Pt will  benefit from skilled PT to improve deficits and pain and to return to PLOF.   OBJECTIVE IMPAIRMENTS: Abnormal gait, decreased activity tolerance, decreased knowledge of use of DME, decreased mobility, difficulty walking, decreased ROM, decreased strength, increased  muscle spasms, improper body mechanics, and pain.   ACTIVITY LIMITATIONS: lifting, bending, standing, squatting, sleeping, stairs, transfers, bed mobility, dressing, hygiene/grooming, locomotion level, and caring for others  PARTICIPATION LIMITATIONS: meal prep, cleaning, laundry, driving, shopping, and community activity  PERSONAL FACTORS: Time since onset of injury/illness/exacerbation are also affecting patient's functional outcome.   REHAB POTENTIAL: Good  CLINICAL DECISION MAKING: Evolving/moderate complexity  EVALUATION COMPLEXITY: Moderate   GOALS: Goals reviewed with patient? Yes  SHORT TERM GOALS: Target date: 08/13/2023  Pt to be independent with initial HEP  Goal status: INITIAL  2.  Pt to report taking seated or supine rest/decompression breaks throughout the day to offload back.   Goal status: INITIAL  3.  Pt to demo optimal mechanics with use of SPC as needed for pain/offloading of back.   Goal status: INITIAL    LONG TERM GOALS: Target date: 09/24/2023    Pt to be independent with final HEP  Goal status: INITIAL  2.  Pt to demo improved pain in R LE to 0-3/10 with standing/walking activities for at least 30 min.   Goal status: INITIAL  3.  Pt to demo ability for walking, stairs, and bend/squat with pain 0-3/10  Goal status: INITIAL  4.  Pt to demo optimal gait mechanics with LRAD, for at least 500 ft, to improve ability for community distances.   Goal status: INITIAL  5.  Pt to report at least 75 % improvement in overall symptoms.   Goal status: INITIAL   PLAN:  PT FREQUENCY: 1-2x/week  PT DURATION: 8 weeks  PLANNED INTERVENTIONS: Therapeutic  exercises, Therapeutic activity, Neuromuscular re-education, Patient/Family education, Self Care, Joint mobilization, Joint manipulation, Stair training, Orthotic/Fit training, DME instructions, Aquatic Therapy, Dry Needling, Electrical stimulation, Cryotherapy, Moist heat, Taping, Ultrasound,  Ionotophoresis 4mg /ml Dexamethasone , Manual therapy,  Vasopneumatic device, Traction, Spinal manipulation, Spinal mobilization,Balance training, Gait training,   PLAN FOR NEXT SESSION:    Tinnie Don, PT, DPT 11:15 AM  07/30/23

## 2023-08-01 ENCOUNTER — Encounter: Payer: Self-pay | Admitting: Physical Therapy

## 2023-08-01 ENCOUNTER — Ambulatory Visit: Payer: Medicare HMO | Admitting: Physical Therapy

## 2023-08-01 DIAGNOSIS — M5416 Radiculopathy, lumbar region: Secondary | ICD-10-CM | POA: Diagnosis not present

## 2023-08-01 NOTE — Therapy (Signed)
 OUTPATIENT PHYSICAL THERAPY LOWER EXTREMITY TREATMENT   Patient Name: Skylor Hughson MRN: [id number] DOB:Nov 29, 1940, 83 y.o., female Today's Date: 08/01/2023  END OF SESSION:  PT End of Session - 08/01/23 1603     Visit Number 2    Number of Visits 16    Date for PT Re-Evaluation 09/24/23    Authorization Type [employer]    PT Start Time 1605    PT Stop Time 1648    PT Time Calculation (min) 43 min    Activity Tolerance Patient tolerated treatment well    Behavior During Therapy WFL for tasks assessed/performed             Past Medical History:  Diagnosis Date   Complication of anesthesia 20 YRS AGO   SLOW TO AWAKEN AFTER CHOLECYSTECTOMY   Family history of adverse reaction to anesthesia 1981   FATHER DIED DURING COLON SURGERY DID NOT WAKE UP AFTER SURGERY FEW DETAILS GIVEN TO PATIENT    Family history of anesthesia complication 03/2017   SISTER WITH PAIN AFTER GALLBLADDER SURGERY, YOUNGER SISTER TROUBLE TROUBLE WAKING UP AFTER KNEE SURGERY IN [facility] 2018   Past Surgical History:  Procedure Laterality Date   BASAL CELL CARCINOMA EXCISION     SEVERAL REMOVED   CHOLECYSTECTOMY  2000   COLONSCOPY  EVERY 5 YEARS   DIAGNOSTIC LAPAROSCOPY  1984   FOR OVARY CYST   FOOT SURGERY Right 1990'S   BONE SURGERY   KNEE ARTHROSCOPY WITH MEDIAL MENISECTOMY Left 06/21/2017   Procedure: LEFT KNEE ARTHROSCOPY WITH MEDIAL MENISCAL DEBRIDEMENT;  Surgeon: Heide Ingle, MD;  Location: [facility];  Service: Orthopedics;  Laterality: Left;   MOES PROCEDURE     X 2   MULTIPLE TOOTH EXTRACTIONS     TONSILLECTOMY  AGE 89   TUBAL LIGATION  1974   Patient Active Problem List   Diagnosis Date Noted   Diverticular disease of colon 11/27/2021   Hypertension 11/27/2021   Hypertriglyceridemia 11/27/2021   Osteopenia 05/29/2021   Bilateral hearing loss 12/12/2018   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   Eval: Pt states ongoing pain down R leg. States minimal low back pain.  Previous pain in May- same but only for 2 weeks and then subsided. Now has been hurting since December.  Activities:  Normally active and goes to [employer]. Noted swelling in bil L>R ankles and feet today.  Pt states most Pain in back of glute and back of thigh, into ankle, not into foot, no numb or tingling.  States comfortable in sitting/no pain, but does have pain with Transitions of sitting/standing.  She has been mostly standing, reports about 12 hrs day. Because she has pain with these transition motions. Standing with Most weight on L LE, offloading R LE. She does  have a cane that she has been using, but did not bring today. She is limping with pain upon walking into clinic today.    PERTINENT HISTORY: N/A  PAIN:  Are you having pain? Yes: NPRS scale: 4- 8 /10 Pain location: R leg pain Pain description:  pain down leg, radiating Aggravating factors: transitions from sitting to standing and walking Relieving factors: none stated, standing with offloading of R LE.   PRECAUTIONS: None  WEIGHT BEARING RESTRICTIONS: No  FALLS:  Has patient fallen in last 6 months? No   PLOF: Independent   PATIENT GOALS:  Decreased pain in R leg, return to regular activities.   NEXT MD VISIT:   OBJECTIVE:   DIAGNOSTIC FINDINGS:   PATIENT SURVEYS:  Next visit    COGNITION: Overall cognitive status: Within functional limits for tasks assessed     SENSATION: WFL  EDEMA:   Mild in R lower leg/foot, moderate in L lower leg/foot -ongoing for about 2 weeks  per pt. Has not seen pcp for this.   POSTURE:    No Significant postural limitations  PALPATION: no pain to palpate today in low back, glute, or hip.    LOWER EXTREMITY ROM: Lumbar: flexion: mild limitation,   Ext: test next visit.  Hips: WFL, mild limitation for R ER.   LOWER EXTREMITY MMT:  MMT Left eval Right  eval  Hip flexion  4 (pain)  Hip extension    Hip abduction  4  Hip adduction    Hip internal rotation    Hip external rotation  4  Knee flexion    Knee extension    Ankle dorsiflexion    Ankle plantarflexion    Ankle inversion    Ankle eversion     (Blank rows = not tested)  LOWER EXTREMITY SPECIAL TESTS:  Neg SLR   FUNCTIONAL TESTS:  Pain with standing/loading of R LE. Immediate pain with standing up from seated position.   GAIT: Distance walked: 50 ft Assistive device utilized: Single point cane Level of assistance: Modified independence Comments: antalgic, decreased weight acceptance on R due to pain, short step length.  Tried walking (out of clinic) with RW, much improved stride length, and pain x 50 ft    TODAY'S TREATMENT:                                                                                                                              DATE:   08/01/2023  Therapeutic Exercise: Aerobic: Supine: pelvic tilts x2 x 10;  SKTC 30 sec x 3 bil;   Piriformis, mod fig 4, 20 sec x 3 bil; Seated HSS 20 sec x 3 on R;  clams GTB x 15;  Seated: Standing: ambulaiton with SPC with cueing for sequencing and stride.  Stretches:  Neuromuscular Re-education: Manual Therapy:  manual lumbar distraction for hip and back Therapeutic Activity:supine to sit and sit to stand  with education on optimal mechanics for less back pain. Practie for lumbar fwd flexion and hSS prior to sit to stand for decreasing pain in LE.  Self Care: education on donning/doffing compression socks, donned today for pt, with directions for wear time. For swelling in feet.  Gait:     PATIENT EDUCATION:  Education details:  PT POC, Exam findings, HEP Person educated: Patient Education method: Explanation, Demonstration, Tactile cues, Verbal cues, and Handouts Education comprehension: verbalized understanding, returned demonstration, verbal cues required, tactile cues required, and needs further education   HOME EXERCISE PROGRAM: Access Code: [email or web address] URL: [email or web address] Date: 07/30/2023 Prepared by: Tinnie Don  Exercises - Supine Posterior Pelvic Tilt  - 2 x daily - 1-2 sets - 10 reps - Hooklying Single Knee to Chest Stretch  - 2-3 x daily - 3 reps - 30 hold - Supine Piriformis Stretch Pulling Heel to Hip  - 2-3 x daily - 3 reps - 20 hold - Supine Diaphragmatic Breathing  - Seated Ankle Pumps on Table  - 3 x daily - 1-2 sets - 10 reps - Seated Hamstring Stretch  - 2 x daily - 3 reps - 30 hold  ASSESSMENT:  CLINICAL IMPRESSION: Swelling improving in R LE, as well as L calf. Still mild swelling in L foot. Education on use of compression socks today. She did see MD, awaiting lab results. She has good tolerance for mobility and ther ex in supine, but continues to have singificant pain with loading of R LE in standing, with increased radicular pain. Plan to progress as able. Pt comfortable with flexion mobility, increased pain with attempts for extension in standing when leg painful.   Eval: Patient presents with primary complaint of  pain in R leg. She has radiating pain in R leg ongoing since December. She has increased swelling in both feet and lower legs today. Recommended she call PCP today. At end of session, pt states that she has been standing for about 12 hrs/day to try to keep back less painful, so it is likely that feet/legs are swollen from this change of activity, but will send to PCP to make sure. Pt with symptoms of sciatic pain in R LE. She has no pain in seated or hooklying positions today or with light ther ex performed.  Discussed trying to sit more often at home for pressure relief.  Pt with significant pain with standing/loading of R LE. Educated on use of SPC or walker for pressure relief as needed. Symptoms consistent with lumbar radiculopathy. Pt with decreased ability for full functional activities due to significant pain. Pt will  benefit from skilled PT to improve deficits and pain and to return to PLOF.   OBJECTIVE IMPAIRMENTS: Abnormal gait, decreased activity tolerance, decreased knowledge of use of DME, decreased mobility, difficulty walking, decreased ROM, decreased strength, increased muscle spasms, improper body mechanics, and pain.   ACTIVITY LIMITATIONS: lifting, bending, standing, squatting, sleeping, stairs, transfers, bed mobility, dressing, hygiene/grooming, locomotion level, and caring for others  PARTICIPATION LIMITATIONS: meal prep, cleaning, laundry, driving, shopping, and community activity  PERSONAL FACTORS: Time since onset of injury/illness/exacerbation are also affecting patient's functional outcome.   REHAB POTENTIAL: Good  CLINICAL DECISION MAKING: Evolving/moderate complexity  EVALUATION COMPLEXITY: Moderate   GOALS: Goals reviewed with patient? Yes  SHORT TERM GOALS: Target date: 08/13/2023  Pt to be independent with initial HEP  Goal status: INITIAL  2.  Pt to report taking seated or supine rest/decompression breaks throughout the day to offload back.   Goal status: INITIAL  3.  Pt to demo optimal mechanics with use of SPC as needed for pain/offloading of back.   Goal status: INITIAL    LONG TERM GOALS: Target date: 09/24/2023    Pt to be independent with final HEP  Goal status: INITIAL  2.  Pt to demo improved pain in  R LE to 0-3/10 with standing/walking activities for at least 30 min.   Goal status: INITIAL  3.  Pt to demo ability for walking, stairs, and bend/squat with pain 0-3/10  Goal status: INITIAL  4.  Pt to demo optimal gait mechanics with  LRAD, for at least 500 ft, to improve ability for community distances.   Goal status: INITIAL  5.  Pt to report at least 75 % improvement in overall symptoms.   Goal status: INITIAL   PLAN:  PT FREQUENCY: 1-2x/week  PT DURATION: 8 weeks  PLANNED INTERVENTIONS: Therapeutic exercises, Therapeutic activity, Neuromuscular re-education, Patient/Family education, Self Care, Joint mobilization, Joint manipulation, Stair training, Orthotic/Fit training, DME instructions, Aquatic Therapy, Dry Needling, Electrical stimulation, Cryotherapy, Moist heat, Taping, Ultrasound, Ionotophoresis 4mg /ml Dexamethasone , Manual therapy,  Vasopneumatic device, Traction, Spinal manipulation, Spinal mobilization,Balance training, Gait training,   PLAN FOR NEXT SESSION:    Tinnie Don, PT, DPT 4:59 PM  08/01/23

## 2023-08-06 ENCOUNTER — Encounter: Payer: Medicare HMO | Admitting: Physical Therapy

## 2023-08-07 ENCOUNTER — Encounter: Payer: Self-pay | Admitting: Physical Therapy

## 2023-08-07 ENCOUNTER — Ambulatory Visit: Payer: Medicare HMO | Admitting: Physical Therapy

## 2023-08-07 DIAGNOSIS — M5416 Radiculopathy, lumbar region: Secondary | ICD-10-CM

## 2023-08-07 NOTE — Therapy (Signed)
 OUTPATIENT PHYSICAL THERAPY LOWER EXTREMITY TREATMENT   Patient Name: Alison Lamb MRN: [id number] DOB:01-04-1941, 83 y.o., female Today's Date: 08/07/2023  END OF SESSION:  PT End of Session - 08/07/23 1401     Visit Number 3    Number of Visits 16    Date for PT Re-Evaluation 09/24/23    Authorization Type [employer]    PT Start Time 1305    PT Stop Time 1356    PT Time Calculation (min) 51 min    Activity Tolerance Patient tolerated treatment well    Behavior During Therapy WFL for tasks assessed/performed              Past Medical History:  Diagnosis Date   Complication of anesthesia 20 YRS AGO   SLOW TO AWAKEN AFTER CHOLECYSTECTOMY   Family history of adverse reaction to anesthesia 1981   FATHER DIED DURING COLON SURGERY DID NOT WAKE UP AFTER SURGERY FEW DETAILS GIVEN TO PATIENT    Family history of anesthesia complication 03/2017   SISTER WITH PAIN AFTER GALLBLADDER SURGERY, YOUNGER SISTER TROUBLE TROUBLE WAKING UP AFTER KNEE SURGERY IN [facility] 2018   Past Surgical History:  Procedure Laterality Date   BASAL CELL CARCINOMA EXCISION     SEVERAL REMOVED   CHOLECYSTECTOMY  2000   COLONSCOPY  EVERY 5 YEARS   DIAGNOSTIC LAPAROSCOPY  1984   FOR OVARY CYST   FOOT SURGERY Right 1990'S   BONE SURGERY   KNEE ARTHROSCOPY WITH MEDIAL MENISECTOMY Left 06/21/2017   Procedure: LEFT KNEE ARTHROSCOPY WITH MEDIAL MENISCAL DEBRIDEMENT;  Surgeon: Ranee Gosselin, MD;  Location: [facility];  Service: Orthopedics;  Laterality: Left;   MOES PROCEDURE     X 2   MULTIPLE TOOTH EXTRACTIONS     TONSILLECTOMY  AGE 44   TUBAL LIGATION  1974   Patient Active Problem List   Diagnosis Date Noted   Diverticular disease of colon 11/27/2021   Hypertension 11/27/2021   Hypertriglyceridemia 11/27/2021   Osteopenia 05/29/2021   Bilateral hearing loss 12/12/2018   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 Swelling much better in feet. Still having pain down R leg also having soreness in/across low back.   Eval: Pt states ongoing pain down R leg. States minimal low back pain.  Previous pain in May- same but only for 2 weeks and then subsided. Now has been hurting since December.  Activities:  Normally active and goes to [employer]. Noted swelling in bil L>R ankles and feet today.  Pt states most Pain in back of glute and back of thigh, into ankle, not into foot, no numb or tingling.  States comfortable in sitting/no pain, but does have pain with Transitions of sitting/standing.  She has been mostly standing, reports about 12 hrs day. Because she has pain with these transition motions. Standing with Most weight on L LE, offloading R LE. She does  have a cane that she has been using, but did not bring today. She is limping with pain upon walking into clinic today.    PERTINENT HISTORY: N/A  PAIN:  Are  you having pain? Yes: NPRS scale: 4- 8 /10 Pain location: R leg pain Pain description: pain down leg, radiating Aggravating factors: transitions from sitting to standing and walking Relieving factors: none stated, standing with offloading of R LE.   PRECAUTIONS: None  WEIGHT BEARING RESTRICTIONS: No  FALLS:  Has patient fallen in last 6 months? No   PLOF: Independent   PATIENT GOALS:  Decreased pain in R leg, return to regular activities.   NEXT MD VISIT:   OBJECTIVE:   DIAGNOSTIC FINDINGS:   PATIENT SURVEYS:  Next visit    COGNITION: Overall cognitive status: Within functional limits for tasks assessed     SENSATION: WFL  EDEMA:    Mild in R lower leg/foot, moderate in L lower leg/foot -ongoing for about 2 weeks per pt. Has not seen pcp for this.   POSTURE:    No Significant postural limitations  PALPATION: no pain to palpate today in low back, glute, or hip.    LOWER EXTREMITY ROM: Lumbar: flexion: mild limitation,   Ext: test next visit.  Hips: WFL, mild limitation for R ER.   LOWER EXTREMITY MMT:  MMT Left eval Right  eval  Hip flexion  4 (pain)  Hip extension    Hip abduction  4  Hip adduction    Hip internal rotation    Hip external rotation  4  Knee flexion    Knee extension    Ankle dorsiflexion    Ankle plantarflexion    Ankle inversion    Ankle eversion     (Blank rows = not tested)  LOWER EXTREMITY SPECIAL TESTS:  Neg SLR   FUNCTIONAL TESTS:  Pain with standing/loading of R LE. Immediate pain with standing up from seated position.   GAIT: Distance walked: 50 ft Assistive device utilized: Single point cane Level of assistance: Modified independence Comments: antalgic, decreased weight acceptance on R due to pain, short step length.  Tried walking (out of clinic) with RW, much improved stride length, and pain x 50 ft    TODAY'S TREATMENT:                                                                                                                              DATE:   08/07/2023  Therapeutic Exercise: Aerobic: Supine: pelvic tilts  x 15;  hip ER ROM x 15/slow ;  LTR x 15/slow;  Seated: Standing:  Stretches:  Neuromuscular Re-education: Manual Therapy:  manual lumbar distraction for hip and back;  Lumbar and thoracic PA mobs; manual stretch for hip flexion and ER and quad stretch;  Therapeutic Activity: sit to stand 2 x 5 education for mechanics, leg and back position.  Self Care:   Gait:  ambulation with her SPC with cueing for sequencing and stride, 45 ft x 8 education on need for use.    PATIENT EDUCATION:  Education details: PT POC, Exam findings, HEP Person educated:  Patient Education method: Explanation, Demonstration, Tactile  cues, Verbal cues, and Handouts Education comprehension: verbalized understanding, returned demonstration, verbal cues required, tactile cues required, and needs further education   HOME EXERCISE PROGRAM: Access Code: [email or web address] URL: [email or web address] Date: 07/30/2023 Prepared by: Sedalia Muta  Exercises - Supine Posterior Pelvic Tilt  - 2 x daily - 1-2 sets - 10 reps - Hooklying Single Knee to Chest Stretch  - 2-3 x daily - 3 reps - 30 hold - Supine Piriformis Stretch Pulling Heel to Hip  - 2-3 x daily - 3 reps - 20 hold - Supine Diaphragmatic Breathing  - Seated Ankle Pumps on Table  - 3 x daily - 1-2 sets - 10 reps - Seated Hamstring Stretch  - 2 x daily - 3 reps - 30 hold  ASSESSMENT:  CLINICAL IMPRESSION: Swelling in feet and lower legs significantly improved today. She is also doing better with less standing during the day. Recommended continued use of SPC temporarily for pain relief and improved gait mechanics. Pt with continued pain with initial standing, lumbar extension, and lumbar R SB. Plan to progress activity and strength as tolerated as well as pain relief for low back and radicular pain.   Eval: Patient presents with primary complaint of  pain in R leg. She has radiating pain in R leg ongoing since December. She has increased swelling in both feet and lower legs today. Recommended she call PCP today. At end of session, pt states that she has been standing for about 12 hrs/day to try to keep back less painful, so it is likely that feet/legs are swollen from this change of activity, but will send to PCP to make sure. Pt with symptoms of sciatic pain in R LE. She has no pain in seated or hooklying positions today or with light ther ex performed. Discussed trying to sit more often at home for pressure relief.  Pt with significant pain with standing/loading of R LE. Educated on use of SPC or walker for  pressure relief as needed. Symptoms consistent with lumbar radiculopathy. Pt with decreased ability for full functional activities due to significant pain. Pt will  benefit from skilled PT to improve deficits and pain and to return to PLOF.   OBJECTIVE IMPAIRMENTS: Abnormal gait, decreased activity tolerance, decreased knowledge of use of DME, decreased mobility, difficulty walking, decreased ROM, decreased strength, increased muscle spasms, improper body mechanics, and pain.   ACTIVITY LIMITATIONS: lifting, bending, standing, squatting, sleeping, stairs, transfers, bed mobility, dressing, hygiene/grooming, locomotion level, and caring for others  PARTICIPATION LIMITATIONS: meal prep, cleaning, laundry, driving, shopping, and community activity  PERSONAL FACTORS: Time since onset of injury/illness/exacerbation are also affecting patient's functional outcome.   REHAB POTENTIAL: Good  CLINICAL DECISION MAKING: Evolving/moderate complexity  EVALUATION COMPLEXITY: Moderate   GOALS: Goals reviewed with patient? Yes  SHORT TERM GOALS: Target date: 08/13/2023  Pt to be independent with initial HEP  Goal status: INITIAL  2.  Pt to report taking seated or supine rest/decompression breaks throughout the day to offload back.   Goal status: INITIAL  3.  Pt to demo optimal mechanics with use of SPC as needed for pain/offloading of back.   Goal status: INITIAL    LONG TERM GOALS: Target date: 09/24/2023    Pt to be independent with final HEP  Goal status: INITIAL  2.  Pt to demo improved pain in R LE to 0-3/10 with standing/walking activities for at least 30 min.   Goal status: INITIAL  3.  Pt to demo ability for walking, stairs,  and bend/squat with pain 0-3/10  Goal status: INITIAL  4.  Pt to demo optimal gait mechanics with LRAD, for at least 500 ft, to improve ability for community distances.   Goal status: INITIAL  5.  Pt to report at least 75 % improvement in overall  symptoms.   Goal status: INITIAL   PLAN:  PT FREQUENCY: 1-2x/week  PT DURATION: 8 weeks  PLANNED INTERVENTIONS: Therapeutic exercises, Therapeutic activity, Neuromuscular re-education, Patient/Family education, Self Care, Joint mobilization, Joint manipulation, Stair training, Orthotic/Fit training, DME instructions, Aquatic Therapy, Dry Needling, Electrical stimulation, Cryotherapy, Moist heat, Taping, Ultrasound, Ionotophoresis 4mg /ml Dexamethasone, Manual therapy,  Vasopneumatic device, Traction, Spinal manipulation, Spinal mobilization,Balance training, Gait training,   PLAN FOR NEXT SESSION:    Sedalia Muta, PT, DPT 2:02 PM  08/07/23

## 2023-08-09 ENCOUNTER — Encounter: Payer: Self-pay | Admitting: Physical Therapy

## 2023-08-09 ENCOUNTER — Ambulatory Visit: Payer: Medicare HMO | Admitting: Physical Therapy

## 2023-08-09 DIAGNOSIS — M5416 Radiculopathy, lumbar region: Secondary | ICD-10-CM | POA: Diagnosis not present

## 2023-08-09 DIAGNOSIS — K5790 Diverticulosis of intestine, part unspecified, without perforation or abscess without bleeding: Secondary | ICD-10-CM | POA: Diagnosis not present

## 2023-08-09 DIAGNOSIS — R1030 Lower abdominal pain, unspecified: Secondary | ICD-10-CM | POA: Diagnosis not present

## 2023-08-09 NOTE — Therapy (Signed)
OUTPATIENT PHYSICAL THERAPY LOWER EXTREMITY TREATMENT   Patient Name: Alison Lamb MRN: [id number] DOB:April 27, 1941, 83 y.o., female Today's Date: 08/09/2023  END OF SESSION:  PT End of Session - 08/09/23 1319     Visit Number 4    Number of Visits 16    Date for PT Re-Evaluation 09/24/23    Authorization Type [employer]    PT Start Time 1320    PT Stop Time 1400    PT Time Calculation (min) 40 min    Activity Tolerance Patient tolerated treatment well    Behavior During Therapy WFL for tasks assessed/performed              Past Medical History:  Diagnosis Date   Complication of anesthesia 20 YRS AGO   SLOW TO AWAKEN AFTER CHOLECYSTECTOMY   Family history of adverse reaction to anesthesia 1981   FATHER DIED DURING COLON SURGERY DID NOT WAKE UP AFTER SURGERY FEW DETAILS GIVEN TO PATIENT    Family history of anesthesia complication 03/2017   SISTER WITH PAIN AFTER GALLBLADDER SURGERY, YOUNGER SISTER TROUBLE TROUBLE WAKING UP AFTER KNEE SURGERY IN [facility] 2018   Past Surgical History:  Procedure Laterality Date   BASAL CELL CARCINOMA EXCISION     SEVERAL REMOVED   CHOLECYSTECTOMY  2000   COLONSCOPY  EVERY 5 YEARS   DIAGNOSTIC LAPAROSCOPY  1984   FOR OVARY CYST   FOOT SURGERY Right 1990'S   BONE SURGERY   KNEE ARTHROSCOPY WITH MEDIAL MENISECTOMY Left 06/21/2017   Procedure: LEFT KNEE ARTHROSCOPY WITH MEDIAL MENISCAL DEBRIDEMENT;  Surgeon: Ranee Gosselin, MD;  Location: [facility];  Service: Orthopedics;  Laterality: Left;   MOES PROCEDURE     X 2   MULTIPLE TOOTH EXTRACTIONS     TONSILLECTOMY  AGE 61   TUBAL LIGATION  1974   Patient Active Problem List   Diagnosis Date Noted   Diverticular disease of colon 11/27/2021   Hypertension 11/27/2021   Hypertriglyceridemia 11/27/2021   Osteopenia 05/29/2021   Bilateral hearing loss 12/12/2018   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 States minimal low back pain.  Previous pain in May- same but only for 2 weeks and then subsided. Now has been hurting since December.  Activities:  Normally active and goes to [employer]. Noted swelling in bil L>R ankles and feet today.  Pt states most Pain in back of glute and back of thigh, into ankle, not into foot, no numb or tingling.  States comfortable in sitting/no pain, but does have pain with Transitions of sitting/standing.  She has been mostly standing, reports about 12 hrs day. Because she has pain with these transition motions. Standing with Most weight on L LE, offloading R LE. She does  have a cane that she has been using, but did not bring today. She is limping with pain upon walking into clinic today.    PERTINENT HISTORY: N/A  PAIN:  Are you having pain? Yes: NPRS scale: 4- 8 /10 Pain location: R leg pain Pain description: pain down leg, radiating  Aggravating factors: transitions from sitting to standing and walking Relieving factors: none stated, standing with offloading of R LE.   PRECAUTIONS: None  WEIGHT BEARING RESTRICTIONS: No  FALLS:  Has patient fallen in last 6 months? No   PLOF: Independent   PATIENT GOALS:  Decreased pain in R leg, return to regular activities.   NEXT MD VISIT:   OBJECTIVE:   DIAGNOSTIC FINDINGS:   PATIENT SURVEYS:  Next visit    COGNITION: Overall cognitive status: Within functional limits for tasks assessed     SENSATION: WFL  EDEMA:   Mild in R lower leg/foot, moderate in L lower leg/foot -ongoing for about 2 weeks per pt. Has not seen pcp for this.    POSTURE:    No Significant postural limitations  PALPATION: no pain to palpate today in low back, glute, or hip.    LOWER EXTREMITY ROM: Lumbar: flexion: mild limitation,   Ext: test next visit.  Hips: WFL, mild limitation for R ER.   LOWER EXTREMITY MMT:  MMT Left eval Right  eval  Hip flexion  4 (pain)  Hip extension    Hip abduction  4  Hip adduction    Hip internal rotation    Hip external rotation  4  Knee flexion    Knee extension    Ankle dorsiflexion    Ankle plantarflexion    Ankle inversion    Ankle eversion     (Blank rows = not tested)  LOWER EXTREMITY SPECIAL TESTS:  Neg SLR   FUNCTIONAL TESTS:  Pain with standing/loading of R LE. Immediate pain with standing up from seated position.   GAIT: Distance walked: 50 ft Assistive device utilized: Single point cane Level of assistance: Modified independence Comments: antalgic, decreased weight acceptance on R due to pain, short step length.  Tried walking (out of clinic) with RW, much improved stride length, and pain x 50 ft    TODAY'S TREATMENT:                                                                                                                              DATE:   08/09/2023  Therapeutic Exercise: Aerobic: bike L1 x 6  min for hip and lumbar ROM.  Supine:   hip ER ROM x 15/slow ;  Clams GTB x 15; S/L hip abd 3 x 5 on R;  Seated: Standing:  Stretches:  Neuromuscular Re-education: Manual Therapy:  manual lumbar distraction for hip and back;   manual stretch for hip flexion and ER  Therapeutic Activity: sit to stand 2 x 5 education for mechanics, leg and back position.  Self Care:   Gait:     PATIENT EDUCATION:  Education details: PT POC, Exam findings, HEP Person educated: Patient Education method: Explanation, Demonstration, Tactile cues, Verbal cues, and Handouts Education comprehension: verbalized understanding, returned demonstration, verbal cues required, tactile cues required,  and needs further education   HOME EXERCISE PROGRAM: Access Code: [email or web address]  URL: [email or web address] Date: 07/30/2023 Prepared by: Sedalia Muta  Exercises - Supine Posterior Pelvic Tilt  - 2 x daily - 1-2 sets - 10 reps - Hooklying Single Knee to Chest Stretch  - 2-3 x daily - 3 reps - 30 hold - Supine Piriformis Stretch Pulling Heel to Hip  - 2-3 x daily - 3 reps - 20 hold - Supine Diaphragmatic Breathing  - Seated Ankle Pumps on Table  - 3 x daily - 1-2 sets - 10 reps - Seated Hamstring Stretch  - 2 x daily - 3 reps - 30 hold  ASSESSMENT:  CLINICAL IMPRESSION: Pt with slightly improved pain today and walking pattern when arriving. She notes continued pain in Leg first thing in am. She has good tolerance for activity today. R hip with noted decrease in ROM/ER, discussed continued mobility for this. She felt very good on bike, for flexion rom, but increased pain in R LE when standing, walking at end of session. She has had increased pain in leg after most sessions. Reviewed continued pain relief for home as well as HEP.   Eval: Patient presents with primary complaint of  pain in R leg. She has radiating pain in R leg ongoing since December. She has increased swelling in both feet and lower legs today. Recommended she call PCP today. At end of session, pt states that she has been standing for about 12 hrs/day to try to keep back less painful, so it is likely that feet/legs are swollen from this change of activity, but will send to PCP to make sure. Pt with symptoms of sciatic pain in R LE. She has no pain in seated or hooklying positions today or with light ther ex performed. Discussed trying to sit more often at home for pressure relief.  Pt with significant pain with standing/loading of R LE. Educated on use of SPC or walker for pressure relief as needed. Symptoms consistent with lumbar radiculopathy. Pt with decreased ability for full functional activities due to significant  pain. Pt will  benefit from skilled PT to improve deficits and pain and to return to PLOF.   OBJECTIVE IMPAIRMENTS: Abnormal gait, decreased activity tolerance, decreased knowledge of use of DME, decreased mobility, difficulty walking, decreased ROM, decreased strength, increased muscle spasms, improper body mechanics, and pain.   ACTIVITY LIMITATIONS: lifting, bending, standing, squatting, sleeping, stairs, transfers, bed mobility, dressing, hygiene/grooming, locomotion level, and caring for others  PARTICIPATION LIMITATIONS: meal prep, cleaning, laundry, driving, shopping, and community activity  PERSONAL FACTORS: Time since onset of injury/illness/exacerbation are also affecting patient's functional outcome.   REHAB POTENTIAL: Good  CLINICAL DECISION MAKING: Evolving/moderate complexity  EVALUATION COMPLEXITY: Moderate   GOALS: Goals reviewed with patient? Yes  SHORT TERM GOALS: Target date: 08/13/2023  Pt to be independent with initial HEP  Goal status: INITIAL  2.  Pt to report taking seated or supine rest/decompression breaks throughout the day to offload back.   Goal status: INITIAL  3.  Pt to demo optimal mechanics with use of SPC as needed for pain/offloading of back.   Goal status: INITIAL    LONG TERM GOALS: Target date: 09/24/2023    Pt to be independent with final HEP  Goal status: INITIAL  2.  Pt to demo improved pain in R LE to 0-3/10 with standing/walking activities for at least 30 min.   Goal status: INITIAL  3.  Pt to demo ability for walking, stairs, and bend/squat with pain 0-3/10  Goal status: INITIAL  4.  Pt to demo optimal gait mechanics with LRAD, for at least 500 ft, to improve ability for community distances.   Goal status: INITIAL  5.  Pt to report at least 75 % improvement in overall symptoms.   Goal status: INITIAL   PLAN:  PT FREQUENCY: 1-2x/week  PT DURATION: 8 weeks  PLANNED INTERVENTIONS: Therapeutic exercises,  Therapeutic activity, Neuromuscular re-education, Patient/Family education, Self Care, Joint mobilization, Joint manipulation, Stair training, Orthotic/Fit training, DME instructions, Aquatic Therapy, Dry Needling, Electrical stimulation, Cryotherapy, Moist heat, Taping, Ultrasound, Ionotophoresis 4mg /ml Dexamethasone, Manual therapy,  Vasopneumatic device, Traction, Spinal manipulation, Spinal mobilization,Balance training, Gait training,   PLAN FOR NEXT SESSION:    Sedalia Muta, PT, DPT 1:19 PM  08/09/23

## 2023-08-13 ENCOUNTER — Encounter: Payer: Self-pay | Admitting: Physical Therapy

## 2023-08-13 ENCOUNTER — Ambulatory Visit: Payer: Medicare HMO | Admitting: Physical Therapy

## 2023-08-13 DIAGNOSIS — M5416 Radiculopathy, lumbar region: Secondary | ICD-10-CM | POA: Diagnosis not present

## 2023-08-13 NOTE — Therapy (Signed)
 OUTPATIENT PHYSICAL THERAPY LOWER EXTREMITY TREATMENT   Patient Name: Alison Lamb MRN: [id number] DOB:09/15/1940, 83 y.o., female Today's Date: 08/13/2023  END OF SESSION:  PT End of Session - 08/13/23 1210     Visit Number 5    Number of Visits 16    Date for PT Re-Evaluation 09/24/23    Authorization Type [employer]    PT Start Time 0940    PT Stop Time 1019    PT Time Calculation (min) 39 min    Activity Tolerance Patient tolerated treatment well    Behavior During Therapy WFL for tasks assessed/performed               Past Medical History:  Diagnosis Date   Complication of anesthesia 20 YRS AGO   SLOW TO AWAKEN AFTER CHOLECYSTECTOMY   Family history of adverse reaction to anesthesia 1981   FATHER DIED DURING COLON SURGERY DID NOT WAKE UP AFTER SURGERY FEW DETAILS GIVEN TO PATIENT    Family history of anesthesia complication 03/2017   SISTER WITH PAIN AFTER GALLBLADDER SURGERY, YOUNGER SISTER TROUBLE TROUBLE WAKING UP AFTER KNEE SURGERY IN [facility] 2018   Past Surgical History:  Procedure Laterality Date   BASAL CELL CARCINOMA EXCISION     SEVERAL REMOVED   CHOLECYSTECTOMY  2000   COLONSCOPY  EVERY 5 YEARS   DIAGNOSTIC LAPAROSCOPY  1984   FOR OVARY CYST   FOOT SURGERY Right 1990'S   BONE SURGERY   KNEE ARTHROSCOPY WITH MEDIAL MENISECTOMY Left 06/21/2017   Procedure: LEFT KNEE ARTHROSCOPY WITH MEDIAL MENISCAL DEBRIDEMENT;  Surgeon: Ranee Gosselin, MD;  Location: [facility];  Service: Orthopedics;  Laterality: Left;   MOES PROCEDURE     X 2   MULTIPLE TOOTH EXTRACTIONS     TONSILLECTOMY  AGE 77   TUBAL LIGATION  1974   Patient Active Problem List   Diagnosis Date Noted   Diverticular disease of colon 11/27/2021   Hypertension 11/27/2021   Hypertriglyceridemia 11/27/2021   Osteopenia 05/29/2021   Bilateral hearing loss 12/12/2018   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 Does feel relief when she takes advil, but is not taking very often.   Eval: Pt states ongoing pain down R leg. States minimal low back pain.  Previous pain in May- same but only for 2 weeks and then subsided. Now has been hurting since December.  Activities:  Normally active and goes to [employer]. Noted swelling in bil L>R ankles and feet today.  Pt states most Pain in back of glute and back of thigh, into ankle, not into foot, no numb or tingling.  States comfortable in sitting/no pain, but does have pain with Transitions of sitting/standing.  She has been mostly standing, reports about 12 hrs day. Because she has pain with these transition motions. Standing with Most weight on L LE, offloading R LE. She does  have a cane that she has been using, but did not bring today. She is limping with pain upon walking into clinic today.    PERTINENT HISTORY: N/A  PAIN:  Are you having pain? Yes: NPRS scale: 4- 8 /10 Pain location:  R leg pain Pain description: pain down leg, radiating Aggravating factors: transitions from sitting to standing and walking Relieving factors: none stated, standing with offloading of R LE.   PRECAUTIONS: None  WEIGHT BEARING RESTRICTIONS: No  FALLS:  Has patient fallen in last 6 months? No   PLOF: Independent   PATIENT GOALS:  Decreased pain in R leg, return to regular activities.   NEXT MD VISIT:   OBJECTIVE:   DIAGNOSTIC FINDINGS:   PATIENT SURVEYS:  Next visit    COGNITION: Overall cognitive status: Within functional limits for tasks assessed     SENSATION: WFL  EDEMA:   Mild in R lower leg/foot, moderate in L lower leg/foot -ongoing for about 2 weeks per  pt. Has not seen pcp for this.   POSTURE:    No Significant postural limitations  PALPATION: no pain to palpate today in low back, glute, or hip.    LOWER EXTREMITY ROM: Lumbar: flexion: mild limitation,   Ext: test next visit.  Hips: WFL, mild limitation for R ER.   LOWER EXTREMITY MMT:  MMT Left eval Right  eval  Hip flexion  4 (pain)  Hip extension    Hip abduction  4  Hip adduction    Hip internal rotation    Hip external rotation  4  Knee flexion    Knee extension    Ankle dorsiflexion    Ankle plantarflexion    Ankle inversion    Ankle eversion     (Blank rows = not tested)  LOWER EXTREMITY SPECIAL TESTS:  Neg SLR   FUNCTIONAL TESTS:  Pain with standing/loading of R LE. Immediate pain with standing up from seated position.   GAIT: Distance walked: 50 ft Assistive device utilized: Single point cane Level of assistance: Modified independence Comments: antalgic, decreased weight acceptance on R due to pain, short step length.  Tried walking (out of clinic) with RW, much improved stride length, and pain x 50 ft    TODAY'S TREATMENT:                                                                                                                              DATE:   08/13/2023   Therapeutic Exercise: Aerobic:  Supine:   hip ER ROM x 10/slow ;   Seated:  hip hinge motion x 10 prior to standing up; sit to stand 2 x 5 education for mechanics, leg and back position.  Standing:  L sb x 10 for opening on R,  Stretches:  Neuromuscular Re-education: Manual Therapy:  manual lumbar distraction for hip and back;   manual stretch for hip flexion and ER ; lumbarPA mobs,  Therapeutic Activity:  Self Care:   Gait:     PATIENT EDUCATION:  Education details: PT POC, Exam findings, HEP Person educated: Patient Education method: Explanation, Demonstration, Tactile cues, Verbal cues, and Handouts Education comprehension: verbalized understanding, returned demonstration,  verbal cues required, tactile cues required, and needs  further education   HOME EXERCISE PROGRAM: Access Code: [email or web address] URL: [email or web address] Date: 07/30/2023 Prepared by: Sedalia Muta  Exercises - Supine Posterior Pelvic Tilt  - 2 x daily - 1-2 sets - 10 reps - Hooklying Single Knee to Chest Stretch  - 2-3 x daily - 3 reps - 30 hold - Supine Piriformis Stretch Pulling Heel to Hip  - 2-3 x daily - 3 reps - 20 hold - Supine Diaphragmatic Breathing  - Seated Ankle Pumps on Table  - 3 x daily - 1-2 sets - 10 reps - Seated Hamstring Stretch  - 2 x daily - 3 reps - 30 hold  ASSESSMENT:  CLINICAL IMPRESSION: Pt with continued pain in R glute and LE with full weight bearing on R side, also increased with lumbar ext and R SB. Continued education on doing motions that feel comfortable, flexion and supine decompression position. Pt has not had significant improvement of pain, recommended she f/u with MD next week.   Eval: Patient presents with primary complaint of  pain in R leg. She has radiating pain in R leg ongoing since December. She has increased swelling in both feet and lower legs today. Recommended she call PCP today. At end of session, pt states that she has been standing for about 12 hrs/day to try to keep back less painful, so it is likely that feet/legs are swollen from this change of activity, but will send to PCP to make sure. Pt with symptoms of sciatic pain in R LE. She has no pain in seated or hooklying positions today or with light ther ex performed. Discussed trying to sit more often at home for pressure relief.  Pt with significant pain with standing/loading of R LE. Educated on use of SPC or walker for pressure relief as needed. Symptoms consistent with lumbar radiculopathy. Pt with decreased ability for full functional activities due to significant pain. Pt will  benefit from skilled PT to improve deficits and pain and to return to PLOF.   OBJECTIVE  IMPAIRMENTS: Abnormal gait, decreased activity tolerance, decreased knowledge of use of DME, decreased mobility, difficulty walking, decreased ROM, decreased strength, increased muscle spasms, improper body mechanics, and pain.   ACTIVITY LIMITATIONS: lifting, bending, standing, squatting, sleeping, stairs, transfers, bed mobility, dressing, hygiene/grooming, locomotion level, and caring for others  PARTICIPATION LIMITATIONS: meal prep, cleaning, laundry, driving, shopping, and community activity  PERSONAL FACTORS: Time since onset of injury/illness/exacerbation are also affecting patient's functional outcome.   REHAB POTENTIAL: Good  CLINICAL DECISION MAKING: Evolving/moderate complexity  EVALUATION COMPLEXITY: Moderate   GOALS: Goals reviewed with patient? Yes  SHORT TERM GOALS: Target date: 08/13/2023  Pt to be independent with initial HEP  Goal status: INITIAL  2.  Pt to report taking seated or supine rest/decompression breaks throughout the day to offload back.   Goal status: INITIAL  3.  Pt to demo optimal mechanics with use of SPC as needed for pain/offloading of back.   Goal status: INITIAL    LONG TERM GOALS: Target date: 09/24/2023    Pt to be independent with final HEP  Goal status: INITIAL  2.  Pt to demo improved pain in R LE to 0-3/10 with standing/walking activities for at least 30 min.   Goal status: INITIAL  3.  Pt to demo ability for walking, stairs, and bend/squat with pain 0-3/10  Goal status: INITIAL  4.  Pt to demo optimal gait mechanics with LRAD, for at least 500 ft, to improve ability for community distances.  Goal status: INITIAL  5.  Pt to report at least 75 % improvement in overall symptoms.   Goal status: INITIAL   PLAN:  PT FREQUENCY: 1-2x/week  PT DURATION: 8 weeks  PLANNED INTERVENTIONS: Therapeutic exercises, Therapeutic activity, Neuromuscular re-education, Patient/Family education, Self Care, Joint mobilization, Joint  manipulation, Stair training, Orthotic/Fit training, DME instructions, Aquatic Therapy, Dry Needling, Electrical stimulation, Cryotherapy, Moist heat, Taping, Ultrasound, Ionotophoresis 4mg /ml Dexamethasone, Manual therapy,  Vasopneumatic device, Traction, Spinal manipulation, Spinal mobilization,Balance training, Gait training,   PLAN FOR NEXT SESSION:    Sedalia Muta, PT, DPT 12:15 PM  08/13/23

## 2023-08-16 ENCOUNTER — Ambulatory Visit: Payer: Medicare HMO | Admitting: Physical Therapy

## 2023-08-16 ENCOUNTER — Encounter: Payer: Self-pay | Admitting: Physical Therapy

## 2023-08-16 DIAGNOSIS — M5416 Radiculopathy, lumbar region: Secondary | ICD-10-CM | POA: Diagnosis not present

## 2023-08-16 NOTE — Therapy (Signed)
 OUTPATIENT PHYSICAL THERAPY LOWER EXTREMITY TREATMENT   Patient Name: Alison Lamb MRN: [id number] DOB:Apr 17, 1941, 83 y.o., female Today's Date: 08/16/2023  END OF SESSION:  PT End of Session - 08/16/23 0931     Visit Number 6    Number of Visits 16    Date for PT Re-Evaluation 09/24/23    Authorization Type [employer]    PT Start Time 0932    PT Stop Time 1015    PT Time Calculation (min) 43 min    Activity Tolerance Patient tolerated treatment well    Behavior During Therapy WFL for tasks assessed/performed               Past Medical History:  Diagnosis Date   Complication of anesthesia 20 YRS AGO   SLOW TO AWAKEN AFTER CHOLECYSTECTOMY   Family history of adverse reaction to anesthesia 1981   FATHER DIED DURING COLON SURGERY DID NOT WAKE UP AFTER SURGERY FEW DETAILS GIVEN TO PATIENT    Family history of anesthesia complication 03/2017   SISTER WITH PAIN AFTER GALLBLADDER SURGERY, YOUNGER SISTER TROUBLE TROUBLE WAKING UP AFTER KNEE SURGERY IN [facility] 2018   Past Surgical History:  Procedure Laterality Date   BASAL CELL CARCINOMA EXCISION     SEVERAL REMOVED   CHOLECYSTECTOMY  2000   COLONSCOPY  EVERY 5 YEARS   DIAGNOSTIC LAPAROSCOPY  1984   FOR OVARY CYST   FOOT SURGERY Right 1990'S   BONE SURGERY   KNEE ARTHROSCOPY WITH MEDIAL MENISECTOMY Left 06/21/2017   Procedure: LEFT KNEE ARTHROSCOPY WITH MEDIAL MENISCAL DEBRIDEMENT;  Surgeon: Ranee Gosselin, MD;  Location: [facility];  Service: Orthopedics;  Laterality: Left;   MOES PROCEDURE     X 2   MULTIPLE TOOTH EXTRACTIONS     TONSILLECTOMY  AGE 52   TUBAL LIGATION  1974   Patient Active Problem List   Diagnosis Date Noted   Diverticular disease of colon 11/27/2021   Hypertension 11/27/2021   Hypertriglyceridemia 11/27/2021   Osteopenia 05/29/2021   Bilateral hearing loss 12/12/2018   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 Less pain in leg this morning. States she does better when she can stay standing for a while, she can sit comfortably, but still getting quite a bit of pain when she stands up from seated position.   Eval: Pt states ongoing pain down R leg. States minimal low back pain.  Previous pain in May- same but only for 2 weeks and then subsided. Now has been hurting since December.  Activities:  Normally active and goes to [employer]. Noted swelling in bil L>R ankles and feet today.  Pt states most Pain in back of glute and back of thigh, into ankle, not into foot, no numb or tingling.  States comfortable in sitting/no pain, but does have pain with Transitions of sitting/standing.  She has been mostly standing, reports about 12 hrs day. Because she has pain with these transition motions. Standing with Most weight on L LE, offloading R LE. She does  have a cane that she has been using, but did not bring today. She is limping with pain upon walking  into clinic today.    PERTINENT HISTORY: N/A  PAIN:  Are you having pain? Yes: NPRS scale: 4- 8 /10 Pain location: R leg pain Pain description: pain down leg, radiating Aggravating factors: transitions from sitting to standing and walking Relieving factors: none stated, standing with offloading of R LE.   PRECAUTIONS: None  WEIGHT BEARING RESTRICTIONS: No  FALLS:  Has patient fallen in last 6 months? No   PLOF: Independent   PATIENT GOALS:  Decreased pain in R leg, return to regular activities.   NEXT MD VISIT:   OBJECTIVE:   DIAGNOSTIC FINDINGS:   PATIENT SURVEYS:  Next visit    COGNITION: Overall cognitive status: Within functional limits for  tasks assessed     SENSATION: WFL  EDEMA:   Mild in R lower leg/foot, moderate in L lower leg/foot -ongoing for about 2 weeks per pt. Has not seen pcp for this.   POSTURE:    No Significant postural limitations  PALPATION: no pain to palpate today in low back, glute, or hip.    LOWER EXTREMITY ROM: Lumbar: flexion: mild limitation,   Ext: test next visit.  Hips: WFL, mild limitation for R ER.   LOWER EXTREMITY MMT:  MMT Left eval Right  eval  Hip flexion  4 (pain)  Hip extension    Hip abduction  4  Hip adduction    Hip internal rotation    Hip external rotation  4  Knee flexion    Knee extension    Ankle dorsiflexion    Ankle plantarflexion    Ankle inversion    Ankle eversion     (Blank rows = not tested)  LOWER EXTREMITY SPECIAL TESTS:  Neg SLR   FUNCTIONAL TESTS:  Pain with standing/loading of R LE. Immediate pain with standing up from seated position.   GAIT: Distance walked: 50 ft Assistive device utilized: Single point cane Level of assistance: Modified independence Comments: antalgic, decreased weight acceptance on R due to pain, short step length.  Tried walking (out of clinic) with RW, much improved stride length, and pain x 50 ft    TODAY'S TREATMENT:                                                                                                                              DATE:   08/16/2023  Therapeutic Exercise: Aerobic:  Supine:  LTR x 15/slow;    TA with education on breathing and optimal contraction;  Supine march with TA x 10 S/L: hip abd x 10 on R;  Seated:  sit to stand x10;   ball rolls for lumbar flexion x 12 Standing:    Stretches:  Neuromuscular Re-education: Manual Therapy:  Therapeutic Activity:  Self Care:   Gait:     PATIENT EDUCATION:  Education details: PT POC, Exam findings, HEP Person educated: Patient Education method: Explanation, Demonstration, Tactile cues, Verbal cues, and Handouts Education comprehension:  verbalized understanding, returned demonstration, verbal cues  required, tactile cues required, and needs further education   HOME EXERCISE PROGRAM: Access Code: [email or web address] URL: [email or web address] Date: 07/30/2023 Prepared by: Sedalia Muta  Exercises - Supine Posterior Pelvic Tilt  - 2 x daily - 1-2 sets - 10 reps - Hooklying Single Knee to Chest Stretch  - 2-3 x daily - 3 reps - 30 hold - Supine Piriformis Stretch Pulling Heel to Hip  - 2-3 x daily - 3 reps - 20 hold - Supine Diaphragmatic Breathing  - Seated Ankle Pumps on Table  - 3 x daily - 1-2 sets - 10 reps - Seated Hamstring Stretch  - 2 x daily - 3 reps - 30 hold  ASSESSMENT:  CLINICAL IMPRESSION: Pt with continued pain in R glute and LE with full weight bearing on R side, also increased with initial standing and walking after being seated. She has had more soreness at end of sessions with standing/walking out, in the last couple visits. Is very comfortable in supine and with flexion. Pt will see MD on Monday, reported ongoing pain /symptoms to MD.   Eval: Patient presents with primary complaint of  pain in R leg. She has radiating pain in R leg ongoing since December. She has increased swelling in both feet and lower legs today. Recommended she call PCP today. At end of session, pt states that she has been standing for about 12 hrs/day to try to keep back less painful, so it is likely that feet/legs are swollen from this change of activity, but will send to PCP to make sure. Pt with symptoms of sciatic pain in R LE. She has no pain in seated or hooklying positions today or with light ther ex performed. Discussed trying to sit more often at home for pressure relief.  Pt with significant pain with standing/loading of R LE. Educated on use of SPC or walker for pressure relief as needed. Symptoms consistent with lumbar radiculopathy. Pt with decreased ability for full functional activities due to significant pain. Pt will   benefit from skilled PT to improve deficits and pain and to return to PLOF.   OBJECTIVE IMPAIRMENTS: Abnormal gait, decreased activity tolerance, decreased knowledge of use of DME, decreased mobility, difficulty walking, decreased ROM, decreased strength, increased muscle spasms, improper body mechanics, and pain.   ACTIVITY LIMITATIONS: lifting, bending, standing, squatting, sleeping, stairs, transfers, bed mobility, dressing, hygiene/grooming, locomotion level, and caring for others  PARTICIPATION LIMITATIONS: meal prep, cleaning, laundry, driving, shopping, and community activity  PERSONAL FACTORS: Time since onset of injury/illness/exacerbation are also affecting patient's functional outcome.   REHAB POTENTIAL: Good  CLINICAL DECISION MAKING: Evolving/moderate complexity  EVALUATION COMPLEXITY: Moderate   GOALS: Goals reviewed with patient? Yes  SHORT TERM GOALS: Target date: 08/13/2023  Pt to be independent with initial HEP  Goal status: INITIAL  2.  Pt to report taking seated or supine rest/decompression breaks throughout the day to offload back.   Goal status: INITIAL  3.  Pt to demo optimal mechanics with use of SPC as needed for pain/offloading of back.   Goal status: INITIAL    LONG TERM GOALS: Target date: 09/24/2023    Pt to be independent with final HEP  Goal status: INITIAL  2.  Pt to demo improved pain in R LE to 0-3/10 with standing/walking activities for at least 30 min.   Goal status: INITIAL  3.  Pt to demo ability for walking, stairs, and bend/squat with pain 0-3/10  Goal status: INITIAL  4.  Pt to demo  optimal gait mechanics with LRAD, for at least 500 ft, to improve ability for community distances.   Goal status: INITIAL  5.  Pt to report at least 75 % improvement in overall symptoms.   Goal status: INITIAL   PLAN:  PT FREQUENCY: 1-2x/week  PT DURATION: 8 weeks  PLANNED INTERVENTIONS: Therapeutic exercises, Therapeutic activity,  Neuromuscular re-education, Patient/Family education, Self Care, Joint mobilization, Joint manipulation, Stair training, Orthotic/Fit training, DME instructions, Aquatic Therapy, Dry Needling, Electrical stimulation, Cryotherapy, Moist heat, Taping, Ultrasound, Ionotophoresis 4mg /ml Dexamethasone, Manual therapy,  Vasopneumatic device, Traction, Spinal manipulation, Spinal mobilization,Balance training, Gait training,   PLAN FOR NEXT SESSION:    Sedalia Muta, PT, DPT 1:07 PM  08/16/23

## 2023-08-19 ENCOUNTER — Ambulatory Visit: Payer: Medicare HMO | Admitting: Family Medicine

## 2023-08-20 ENCOUNTER — Encounter: Payer: Medicare HMO | Admitting: Physical Therapy

## 2023-08-23 ENCOUNTER — Encounter: Payer: Medicare HMO | Admitting: Physical Therapy

## 2023-08-23 NOTE — Progress Notes (Signed)
   I, Stevenson Clinch, CMA acting as a scribe for [name], MD.  Alison Lamb is a 83 y.o. female who presents to [facility] today for cont'd LBP. Pt was last seen by Dr. Denyse Amass on 07/05/23 and was prescribed prednisone, gabapentin, and was referred to PT, completing 4 visits.  Today, pt reports continued pain in the lower back, hip, and gluteal region. Will occasionally have new aches and pains in the glute. Sx worse with sitting. Compliant with HEP. Get pretty good relief with IBU but doesn't take often. PT recommended coming back for re-eval.   Dx imaging: 07/05/23 L-spine & R hip XR  Pertinent review of systems: No fevers or chills  Relevant historical information: Hypertension.  Claustrophobia.   Exam:  BP (!) 134/94   Pulse 75   Ht 5\' 6"  (1.676 m)   Wt 170 lb (77.1 kg)   SpO2 95%   BMI 27.44 kg/m  General: Well Developed, well nourished, and in no acute distress.   MSK: L-spine decreased lumbar motion.  Lower extremity strength mildly reduced hip abduction.  Antalgic gait.     Assessment and Plan: 83 y.o. female with right leg pain due to lumbar radiculopathy.  Differential does include hip abductor tendinopathy.  Plan for MRI of the lumbar spine to evaluate for lumbar radiculopathy more accurately.  She does note some MRI claustrophobia so we will try using a large bore MRI and prescribe lorazepam.  She is not cannot tolerate doing a L-spine and hip at the same time.  Okay to continue PT while we are doing the workup.   PDMP reviewed during this encounter. Orders Placed This Encounter  Procedures   MR Lumbar Spine Wo Contrast    Standing Status:   Future    Expiration Date:   08/25/2024    What is the patient's sedation requirement?:   Anti-anxiety    Does the patient have a pacemaker or implanted devices?:   No    Preferred imaging location?:   [facility] (table limit-550lbs)   Meds ordered this encounter  Medications    LORazepam (ATIVAN) 0.5 MG tablet    Sig: 1-2 tabs 30 - 60 min prior to MRI. Do not drive with this medicine.    Dispense:  4 tablet    Refill:  0     Discussed warning signs or symptoms. Please see discharge instructions. Patient expresses understanding.   The above documentation has been reviewed and is accurate and complete [name], M.D.

## 2023-08-26 ENCOUNTER — Encounter: Payer: Self-pay | Admitting: Family Medicine

## 2023-08-26 ENCOUNTER — Ambulatory Visit: Payer: Medicare HMO | Admitting: Family Medicine

## 2023-08-26 VITALS — BP 134/94 | HR 75 | Ht 66.0 in | Wt 170.0 lb

## 2023-08-26 DIAGNOSIS — M5441 Lumbago with sciatica, right side: Secondary | ICD-10-CM | POA: Diagnosis not present

## 2023-08-26 DIAGNOSIS — G8929 Other chronic pain: Secondary | ICD-10-CM

## 2023-08-26 MED ORDER — LORAZEPAM 0.5 MG PO TABS: ORAL_TABLET | ORAL | 0 refills | Status: DC

## 2023-08-26 NOTE — Patient Instructions (Addendum)
 Thank you for coming in today.  You should hear from MRI scheduling within 1 week. If you do not hear please let me know.    Ok to continue PT while waiting on the MRI.   Once we get results back I will likely order a back injection.

## 2023-08-27 ENCOUNTER — Ambulatory Visit: Payer: Medicare HMO | Admitting: Family Medicine

## 2023-08-27 DIAGNOSIS — E781 Pure hyperglyceridemia: Secondary | ICD-10-CM | POA: Diagnosis not present

## 2023-08-27 DIAGNOSIS — I10 Essential (primary) hypertension: Secondary | ICD-10-CM | POA: Diagnosis not present

## 2023-08-28 ENCOUNTER — Encounter: Payer: Self-pay | Admitting: Physical Therapy

## 2023-08-28 ENCOUNTER — Ambulatory Visit: Payer: Medicare HMO | Admitting: Physical Therapy

## 2023-08-28 DIAGNOSIS — M5416 Radiculopathy, lumbar region: Secondary | ICD-10-CM | POA: Diagnosis not present

## 2023-08-28 NOTE — Therapy (Signed)
 OUTPATIENT PHYSICAL THERAPY LOWER EXTREMITY TREATMENT   Patient Name: Alison Lamb MRN: [id number] DOB:18-Feb-1941, 83 y.o., female Today's Date: 08/28/2023  END OF SESSION:  PT End of Session - 08/28/23 1847     Visit Number 7    Number of Visits 16    Date for PT Re-Evaluation 09/24/23    Authorization Type [employer]na Medicare    PT Start Time 1302    PT Stop Time 1345    PT Time Calculation (min) 43 min    Activity Tolerance Patient tolerated treatment well    Behavior During Therapy WFL for tasks assessed/performed                Past Medical History:  Diagnosis Date   Complication of anesthesia 20 YRS AGO   SLOW TO AWAKEN AFTER CHOLECYSTECTOMY   Family history of adverse reaction to anesthesia 1981   FATHER DIED DURING COLON SURGERY DID NOT WAKE UP AFTER SURGERY FEW DETAILS GIVEN TO PATIENT    Family history of anesthesia complication 03/2017   SISTER WITH PAIN AFTER GALLBLADDER SURGERY, YOUNGER SISTER TROUBLE TROUBLE WAKING UP AFTER KNEE SURGERY IN [facility] 2018   Past Surgical History:  Procedure Laterality Date   BASAL CELL CARCINOMA EXCISION     SEVERAL REMOVED   CHOLECYSTECTOMY  2000   COLONSCOPY  EVERY 5 YEARS   DIAGNOSTIC LAPAROSCOPY  1984   FOR OVARY CYST   FOOT SURGERY Right 1990'S   BONE SURGERY   KNEE ARTHROSCOPY WITH MEDIAL MENISECTOMY Left 06/21/2017   Procedure: LEFT KNEE ARTHROSCOPY WITH MEDIAL MENISCAL DEBRIDEMENT;  Surgeon: Ranee Gosselin, MD;  Location: [facility];  Service: Orthopedics;  Laterality: Left;   MOES PROCEDURE     X 2   MULTIPLE TOOTH EXTRACTIONS     TONSILLECTOMY  AGE 20   TUBAL LIGATION  1974   Patient Active Problem List   Diagnosis Date Noted   Diverticular disease of colon 11/27/2021   Hypertension 11/27/2021   Hypertriglyceridemia 11/27/2021   Osteopenia 05/29/2021   Bilateral hearing loss 12/12/2018   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 Saw MD, getting scheduled for MRI.   Eval: Pt states ongoing pain down R leg. States minimal low back pain.  Previous pain in May- same but only for 2 weeks and then subsided. Now has been hurting since December.  Activities:  Normally active and goes to [employer]. Noted swelling in bil L>R ankles and feet today.  Pt states most Pain in back of glute and back of thigh, into ankle, not into foot, no numb or tingling.  States comfortable in sitting/no pain, but does have pain with Transitions of sitting/standing.  She has been mostly standing, reports about 12 hrs day. Because she has pain with these transition motions. Standing with Most weight on L LE, offloading R LE. She does  have a cane that she has been using, but did not bring today. She is limping with pain upon walking into clinic today.    PERTINENT HISTORY: N/A  PAIN:  Are you having pain? Yes: NPRS scale: 4- 8 /10 Pain location: R leg  pain Pain description: pain down leg, radiating Aggravating factors: transitions from sitting to standing and walking Relieving factors: none stated, standing with offloading of R LE.   PRECAUTIONS: None  WEIGHT BEARING RESTRICTIONS: No  FALLS:  Has patient fallen in last 6 months? No   PLOF: Independent   PATIENT GOALS:  Decreased pain in R leg, return to regular activities.   NEXT MD VISIT:   OBJECTIVE:   DIAGNOSTIC FINDINGS:   PATIENT SURVEYS:  Next visit    COGNITION: Overall cognitive status: Within functional limits for tasks assessed     SENSATION: WFL  EDEMA:   Mild in R lower leg/foot, moderate in L lower leg/foot -ongoing for about  2 weeks per pt. Has not seen pcp for this.   POSTURE:    No Significant postural limitations  PALPATION: no pain to palpate today in low back, glute, or hip.    LOWER EXTREMITY ROM: Lumbar: flexion: mild limitation,   Ext: test next visit.  Hips: WFL, mild limitation for R ER.   LOWER EXTREMITY MMT:  MMT Left eval Right  eval  Hip flexion  4 (pain)  Hip extension    Hip abduction  4  Hip adduction    Hip internal rotation    Hip external rotation  4  Knee flexion    Knee extension    Ankle dorsiflexion    Ankle plantarflexion    Ankle inversion    Ankle eversion     (Blank rows = not tested)  LOWER EXTREMITY SPECIAL TESTS:  Neg SLR   FUNCTIONAL TESTS:  Pain with standing/loading of R LE. Immediate pain with standing up from seated position.   GAIT: Distance walked: 50 ft Assistive device utilized: Single point cane Level of assistance: Modified independence Comments: antalgic, decreased weight acceptance on R due to pain, short step length.  Tried walking (out of clinic) with RW, much improved stride length, and pain x 50 ft    TODAY'S TREATMENT:                                                                                                                              DATE:   08/28/2023  Therapeutic Exercise: Aerobic:  Supine:   Prone: prone on 2 pillows x 3 min, on 1 pillow x 1 min, flat x 1 min, Prone on elbows x 2 min,  Prone press ups 2 x 10;  ( no pain in this position)  S/L:  Seated:   Standing:    Stretches:  Neuromuscular Re-education: Manual Therapy:  LAD R LE,  Therapeutic Activity:  Self Care:   Gait:  attempts for weight bearing on R LE, attempts for weight shifts, Ambulation with RW x 40 ft, cueing for optimal walker use, for offloading R LE.     Therapeutic Exercise: Aerobic:  Supine:  LTR x 15/slow;    TA with education on breathing and optimal contraction;  Supine march with TA x  10 S/L: hip abd x 10 on R;  Seated:  sit to stand x10;    ball rolls for lumbar flexion x 12 Standing:    Stretches:  Neuromuscular Re-education: Manual Therapy:  Therapeutic Activity:  Self Care:   Gait:     PATIENT EDUCATION:  Education details: PT POC, Exam findings, HEP Person educated: Patient Education method: Explanation, Demonstration, Tactile cues, Verbal cues, and Handouts Education comprehension: verbalized understanding, returned demonstration, verbal cues required, tactile cues required, and needs further education   HOME EXERCISE PROGRAM: Access Code: [email or web address] URL: [email or web address] Date: 07/30/2023 Prepared by: Sedalia Muta  Exercises - Supine Posterior Pelvic Tilt  - 2 x daily - 1-2 sets - 10 reps - Hooklying Single Knee to Chest Stretch  - 2-3 x daily - 3 reps - 30 hold - Supine Piriformis Stretch Pulling Heel to Hip  - 2-3 x daily - 3 reps - 20 hold - Supine Diaphragmatic Breathing  - Seated Ankle Pumps on Table  - 3 x daily - 1-2 sets - 10 reps - Seated Hamstring Stretch  - 2 x daily - 3 reps - 30 hold  ASSESSMENT:  CLINICAL IMPRESSION: Pt with continued pain in LE and much difficulty bearing weight on R LE for gait. Some improvement with use of RW, pt educated on benefits of offloading LE for improved gait efficiency. She tolerated prone position well today, but still did not change pain when she comes into standing position. Pt will be scheduled for MRI.    Eval: Patient presents with primary complaint of  pain in R leg. She has radiating pain in R leg ongoing since December. She has increased swelling in both feet and lower legs today. Recommended she call PCP today. At end of session, pt states that she has been standing for about 12 hrs/day to try to keep back less painful, so it is likely that feet/legs are swollen from this change of activity, but will send to PCP to make sure. Pt with symptoms of sciatic pain in R LE. She has no pain in seated or hooklying positions today or with light ther  ex performed. Discussed trying to sit more often at home for pressure relief.  Pt with significant pain with standing/loading of R LE. Educated on use of SPC or walker for pressure relief as needed. Symptoms consistent with lumbar radiculopathy. Pt with decreased ability for full functional activities due to significant pain. Pt will  benefit from skilled PT to improve deficits and pain and to return to PLOF.   OBJECTIVE IMPAIRMENTS: Abnormal gait, decreased activity tolerance, decreased knowledge of use of DME, decreased mobility, difficulty walking, decreased ROM, decreased strength, increased muscle spasms, improper body mechanics, and pain.   ACTIVITY LIMITATIONS: lifting, bending, standing, squatting, sleeping, stairs, transfers, bed mobility, dressing, hygiene/grooming, locomotion level, and caring for others  PARTICIPATION LIMITATIONS: meal prep, cleaning, laundry, driving, shopping, and community activity  PERSONAL FACTORS: Time since onset of injury/illness/exacerbation are also affecting patient's functional outcome.   REHAB POTENTIAL: Good  CLINICAL DECISION MAKING: Evolving/moderate complexity  EVALUATION COMPLEXITY: Moderate   GOALS: Goals reviewed with patient? Yes  SHORT TERM GOALS: Target date: 08/13/2023  Pt to be independent with initial HEP  Goal status: INITIAL  2.  Pt to report taking seated or supine rest/decompression breaks throughout the day to offload back.   Goal status: INITIAL  3.  Pt to demo optimal mechanics with use of SPC as needed for pain/offloading of back.   Goal status: INITIAL  LONG TERM GOALS: Target date: 09/24/2023    Pt to be independent with final HEP  Goal status: INITIAL  2.  Pt to demo improved pain in R LE to 0-3/10 with standing/walking activities for at least 30 min.   Goal status: INITIAL  3.  Pt to demo ability for walking, stairs, and bend/squat with pain 0-3/10  Goal status: INITIAL  4.  Pt to demo optimal gait  mechanics with LRAD, for at least 500 ft, to improve ability for community distances.   Goal status: INITIAL  5.  Pt to report at least 75 % improvement in overall symptoms.   Goal status: INITIAL   PLAN:  PT FREQUENCY: 1-2x/week  PT DURATION: 8 weeks  PLANNED INTERVENTIONS: Therapeutic exercises, Therapeutic activity, Neuromuscular re-education, Patient/Family education, Self Care, Joint mobilization, Joint manipulation, Stair training, Orthotic/Fit training, DME instructions, Aquatic Therapy, Dry Needling, Electrical stimulation, Cryotherapy, Moist heat, Taping, Ultrasound, Ionotophoresis 4mg /ml Dexamethasone, Manual therapy,  Vasopneumatic device, Traction, Spinal manipulation, Spinal mobilization,Balance training, Gait training,   PLAN FOR NEXT SESSION:    Sedalia Muta, PT, DPT 6:47 PM  08/28/23

## 2023-08-29 DIAGNOSIS — Z4689 Encounter for fitting and adjustment of other specified devices: Secondary | ICD-10-CM | POA: Diagnosis not present

## 2023-08-30 ENCOUNTER — Encounter: Payer: Self-pay | Admitting: Physical Therapy

## 2023-08-30 ENCOUNTER — Telehealth: Payer: Self-pay | Admitting: Family Medicine

## 2023-08-30 ENCOUNTER — Ambulatory Visit: Payer: Medicare HMO | Admitting: Physical Therapy

## 2023-08-30 DIAGNOSIS — M5416 Radiculopathy, lumbar region: Secondary | ICD-10-CM

## 2023-08-30 NOTE — Telephone Encounter (Signed)
 Per order note:  08/27/23 1st attempt unable to lvm, phone only rings/epic jw   [facility] [phone]   Called pt and advised. She will reach out to [facility] to schedule.

## 2023-08-30 NOTE — Telephone Encounter (Signed)
 Patient called and said Dr. Denyse Amass ordered an MRI and if she not heard from them by Friday to give him a call.

## 2023-08-30 NOTE — Therapy (Signed)
 OUTPATIENT PHYSICAL THERAPY LOWER EXTREMITY TREATMENT   Patient Name: Nova Schmuhl MRN: [id number] DOB:1940/07/17, 83 y.o., female Today's Date: 08/30/2023  END OF SESSION:  PT End of Session - 08/30/23 1229     Visit Number 8    Number of Visits 16    Date for PT Re-Evaluation 09/24/23    Authorization Type [employer]    PT Start Time 1233    PT Stop Time 1315    PT Time Calculation (min) 42 min    Activity Tolerance Patient tolerated treatment well    Behavior During Therapy WFL for tasks assessed/performed                Past Medical History:  Diagnosis Date   Complication of anesthesia 20 YRS AGO   SLOW TO AWAKEN AFTER CHOLECYSTECTOMY   Family history of adverse reaction to anesthesia 1981   FATHER DIED DURING COLON SURGERY DID NOT WAKE UP AFTER SURGERY FEW DETAILS GIVEN TO PATIENT    Family history of anesthesia complication 03/2017   SISTER WITH PAIN AFTER GALLBLADDER SURGERY, YOUNGER SISTER TROUBLE TROUBLE WAKING UP AFTER KNEE SURGERY IN [facility] 2018   Past Surgical History:  Procedure Laterality Date   BASAL CELL CARCINOMA EXCISION     SEVERAL REMOVED   CHOLECYSTECTOMY  2000   COLONSCOPY  EVERY 5 YEARS   DIAGNOSTIC LAPAROSCOPY  1984   FOR OVARY CYST   FOOT SURGERY Right 1990'S   BONE SURGERY   KNEE ARTHROSCOPY WITH MEDIAL MENISECTOMY Left 06/21/2017   Procedure: LEFT KNEE ARTHROSCOPY WITH MEDIAL MENISCAL DEBRIDEMENT;  Surgeon: Ranee Gosselin, MD;  Location: [facility];  Service: Orthopedics;  Laterality: Left;   MOES PROCEDURE     X 2   MULTIPLE TOOTH EXTRACTIONS     TONSILLECTOMY  AGE 13   TUBAL LIGATION  1974   Patient Active Problem List   Diagnosis Date Noted   Diverticular disease of colon 11/27/2021   Hypertension 11/27/2021   Hypertriglyceridemia 11/27/2021   Osteopenia 05/29/2021   Bilateral hearing loss 12/12/2018   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 Better with anti-inflammatory. She has been taking a little more often.  Did take before session today.   Eval: Pt states ongoing pain down R leg. States minimal low back pain.  Previous pain in May- same but only for 2 weeks and then subsided. Now has been hurting since December.  Activities:  Normally active and goes to [employer]. Noted swelling in bil L>R ankles and feet today.  Pt states most Pain in back of glute and back of thigh, into ankle, not into foot, no numb or tingling.  States comfortable in sitting/no pain, but does have pain with Transitions of sitting/standing.  She has been mostly standing, reports about 12 hrs day. Because she has pain with these transition motions. Standing with Most weight on L LE, offloading R LE. She does  have a cane that she has been using, but did not bring today. She is limping with pain upon walking into clinic today.    PERTINENT HISTORY: N/A  PAIN:  Are you having  pain? Yes: NPRS scale: 4- 8 /10 Pain location: R leg pain Pain description: pain down leg, radiating Aggravating factors: transitions from sitting to standing and walking Relieving factors: none stated, standing with offloading of R LE.   PRECAUTIONS: None  WEIGHT BEARING RESTRICTIONS: No  FALLS:  Has patient fallen in last 6 months? No   PLOF: Independent   PATIENT GOALS:  Decreased pain in R leg, return to regular activities.   NEXT MD VISIT:   OBJECTIVE:   DIAGNOSTIC FINDINGS:   PATIENT SURVEYS:  Next visit    COGNITION: Overall cognitive status: Within functional limits for tasks assessed     SENSATION: WFL  EDEMA:   Mild  in R lower leg/foot, moderate in L lower leg/foot -ongoing for about 2 weeks per pt. Has not seen pcp for this.   POSTURE:    No Significant postural limitations  PALPATION: no pain to palpate today in low back, glute, or hip.    LOWER EXTREMITY ROM: Lumbar: flexion: mild limitation,   Ext: test next visit.  Hips: WFL, mild limitation for R ER.   LOWER EXTREMITY MMT:  MMT Left eval Right  eval  Hip flexion  4 (pain)  Hip extension    Hip abduction  4  Hip adduction    Hip internal rotation    Hip external rotation  4  Knee flexion    Knee extension    Ankle dorsiflexion    Ankle plantarflexion    Ankle inversion    Ankle eversion     (Blank rows = not tested)  LOWER EXTREMITY SPECIAL TESTS:  Neg SLR   FUNCTIONAL TESTS:  Pain with standing/loading of R LE. Immediate pain with standing up from seated position.   GAIT: Distance walked: 50 ft Assistive device utilized: Single point cane Level of assistance: Modified independence Comments: antalgic, decreased weight acceptance on R due to pain, short step length.  Tried walking (out of clinic) with RW, much improved stride length, and pain x 50 ft    TODAY'S TREATMENT:                                                                                                                              DATE:   08/30/2023  Therapeutic Exercise: Aerobic:  Supine:   SLR 2 x 10 bil;  Hip Clams RTB x 20; trial bridge- pain in R LE.  S/L: hip abd x 15 bil;  Seated:   Standing:    Stretches:  Neuromuscular Re-education: Manual Therapy:  LAD R LE,  Therapeutic Activity:  Self Care:   Gait:   Ambulation with RW x 45 ft x 6 then x 4; , cueing for optimal walker use, for offloading R LE, uipright posture   Previous:  Therapeutic Exercise: Aerobic:  Supine:   Prone: prone on 2 pillows x 3 min, on 1 pillow x 1 min, flat x 1 min, Prone on elbows x 2 min,  Prone  press ups 2 x 10;  ( no pain in this position)  S/L:  Seated:    Standing:    Stretches:  Neuromuscular Re-education: Manual Therapy:  LAD R LE,  Therapeutic Activity:  Self Care:   Gait:  attempts for weight bearing on R LE, attempts for weight shifts, Ambulation with RW x 40 ft, cueing for optimal walker use, for offloading R LE.     Therapeutic Exercise: Aerobic:  Supine:  LTR x 15/slow;    TA with education on breathing and optimal contraction;  Supine march with TA x 10 S/L: hip abd x 10 on R;  Seated:  sit to stand x10;   ball rolls for lumbar flexion x 12 Standing:    Stretches:  Neuromuscular Re-education: Manual Therapy:  Therapeutic Activity:  Self Care:   Gait:     PATIENT EDUCATION:  Education details: PT POC, Exam findings, HEP Person educated: Patient Education method: Explanation, Demonstration, Tactile cues, Verbal cues, and Handouts Education comprehension: verbalized understanding, returned demonstration, verbal cues required, tactile cues required, and needs further education   HOME EXERCISE PROGRAM: Access Code: [email or web address] URL: [email or web address] Date: 07/30/2023 Prepared by: Sedalia Muta  Exercises - Supine Posterior Pelvic Tilt  - 2 x daily - 1-2 sets - 10 reps - Hooklying Single Knee to Chest Stretch  - 2-3 x daily - 3 reps - 30 hold - Supine Piriformis Stretch Pulling Heel to Hip  - 2-3 x daily - 3 reps - 20 hold - Supine Diaphragmatic Breathing  - Seated Ankle Pumps on Table  - 3 x daily - 1-2 sets - 10 reps - Seated Hamstring Stretch  - 2 x daily - 3 reps - 30 hold  ASSESSMENT:  CLINICAL IMPRESSION: Pt with some improvement in pain in LE today, likley from taking anti-inflammatory. She has improved gait pattern and ability and improved weight acceptance on R LE. She has good ability for use of RW, recommended use for offloading for back but pt does not want to use. She has good ability for light strengthening today.   Eval: Patient presents with primary complaint of  pain in R leg. She  has radiating pain in R leg ongoing since December. She has increased swelling in both feet and lower legs today. Recommended she call PCP today. At end of session, pt states that she has been standing for about 12 hrs/day to try to keep back less painful, so it is likely that feet/legs are swollen from this change of activity, but will send to PCP to make sure. Pt with symptoms of sciatic pain in R LE. She has no pain in seated or hooklying positions today or with light ther ex performed. Discussed trying to sit more often at home for pressure relief.  Pt with significant pain with standing/loading of R LE. Educated on use of SPC or walker for pressure relief as needed. Symptoms consistent with lumbar radiculopathy. Pt with decreased ability for full functional activities due to significant pain. Pt will  benefit from skilled PT to improve deficits and pain and to return to PLOF.   OBJECTIVE IMPAIRMENTS: Abnormal gait, decreased activity tolerance, decreased knowledge of use of DME, decreased mobility, difficulty walking, decreased ROM, decreased strength, increased muscle spasms, improper body mechanics, and pain.   ACTIVITY LIMITATIONS: lifting, bending, standing, squatting, sleeping, stairs, transfers, bed mobility, dressing, hygiene/grooming, locomotion level, and caring for others  PARTICIPATION LIMITATIONS: meal prep, cleaning, laundry, driving, shopping, and community activity  PERSONAL FACTORS: Time since onset  of injury/illness/exacerbation are also affecting patient's functional outcome.   REHAB POTENTIAL: Good  CLINICAL DECISION MAKING: Evolving/moderate complexity  EVALUATION COMPLEXITY: Moderate   GOALS: Goals reviewed with patient? Yes  SHORT TERM GOALS: Target date: 08/13/2023  Pt to be independent with initial HEP  Goal status: INITIAL  2.  Pt to report taking seated or supine rest/decompression breaks throughout the day to offload back.   Goal status: INITIAL  3.  Pt to  demo optimal mechanics with use of SPC as needed for pain/offloading of back.   Goal status: INITIAL    LONG TERM GOALS: Target date: 09/24/2023    Pt to be independent with final HEP  Goal status: INITIAL  2.  Pt to demo improved pain in R LE to 0-3/10 with standing/walking activities for at least 30 min.   Goal status: INITIAL  3.  Pt to demo ability for walking, stairs, and bend/squat with pain 0-3/10  Goal status: INITIAL  4.  Pt to demo optimal gait mechanics with LRAD, for at least 500 ft, to improve ability for community distances.   Goal status: INITIAL  5.  Pt to report at least 75 % improvement in overall symptoms.   Goal status: INITIAL   PLAN:  PT FREQUENCY: 1-2x/week  PT DURATION: 8 weeks  PLANNED INTERVENTIONS: Therapeutic exercises, Therapeutic activity, Neuromuscular re-education, Patient/Family education, Self Care, Joint mobilization, Joint manipulation, Stair training, Orthotic/Fit training, DME instructions, Aquatic Therapy, Dry Needling, Electrical stimulation, Cryotherapy, Moist heat, Taping, Ultrasound, Ionotophoresis 4mg /ml Dexamethasone, Manual therapy,  Vasopneumatic device, Traction, Spinal manipulation, Spinal mobilization,Balance training, Gait training,   PLAN FOR NEXT SESSION:    Sedalia Muta, PT, DPT 1:19 PM  08/30/23

## 2023-09-03 ENCOUNTER — Ambulatory Visit: Payer: Medicare HMO | Admitting: Physical Therapy

## 2023-09-03 ENCOUNTER — Encounter: Payer: Self-pay | Admitting: Family Medicine

## 2023-09-03 ENCOUNTER — Encounter: Payer: Self-pay | Admitting: Physical Therapy

## 2023-09-03 DIAGNOSIS — M5416 Radiculopathy, lumbar region: Secondary | ICD-10-CM

## 2023-09-03 NOTE — Therapy (Signed)
 OUTPATIENT PHYSICAL THERAPY LOWER EXTREMITY TREATMENT   Patient Name: Alison Lamb MRN: [id number] DOB:1941-01-01, 83 y.o., female Today's Date: 09/03/2023  END OF SESSION:  PT End of Session - 09/03/23 1152     Visit Number 9    Number of Visits 16    Date for PT Re-Evaluation 09/24/23    Authorization Type [employer]    PT Start Time 1020    PT Stop Time 1058    PT Time Calculation (min) 38 min    Activity Tolerance Patient tolerated treatment well    Behavior During Therapy WFL for tasks assessed/performed                 Past Medical History:  Diagnosis Date   Complication of anesthesia 20 YRS AGO   SLOW TO AWAKEN AFTER CHOLECYSTECTOMY   Family history of adverse reaction to anesthesia 1981   FATHER DIED DURING COLON SURGERY DID NOT WAKE UP AFTER SURGERY FEW DETAILS GIVEN TO PATIENT    Family history of anesthesia complication 03/2017   SISTER WITH PAIN AFTER GALLBLADDER SURGERY, YOUNGER SISTER TROUBLE TROUBLE WAKING UP AFTER KNEE SURGERY IN [facility] 2018   Past Surgical History:  Procedure Laterality Date   BASAL CELL CARCINOMA EXCISION     SEVERAL REMOVED   CHOLECYSTECTOMY  2000   COLONSCOPY  EVERY 5 YEARS   DIAGNOSTIC LAPAROSCOPY  1984   FOR OVARY CYST   FOOT SURGERY Right 1990'S   BONE SURGERY   KNEE ARTHROSCOPY WITH MEDIAL MENISECTOMY Left 06/21/2017   Procedure: LEFT KNEE ARTHROSCOPY WITH MEDIAL MENISCAL DEBRIDEMENT;  Surgeon: Ranee Gosselin, MD;  Location: [facility];  Service: Orthopedics;  Laterality: Left;   MOES PROCEDURE     X 2   MULTIPLE TOOTH EXTRACTIONS     TONSILLECTOMY  AGE 33   TUBAL LIGATION  1974   Patient Active Problem List   Diagnosis Date Noted   Diverticular disease of colon 11/27/2021   Hypertension 11/27/2021   Hypertriglyceridemia 11/27/2021   Osteopenia 05/29/2021   Bilateral hearing loss 12/12/2018   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 She also has been taking anti-inflammatory more often, which helps quite a bit.   Eval: Pt states ongoing pain down R leg. States minimal low back pain.  Previous pain in May- same but only for 2 weeks and then subsided. Now has been hurting since December.  Activities:  Normally active and goes to [employer]. Noted swelling in bil L>R ankles and feet today.  Pt states most Pain in back of glute and back of thigh, into ankle, not into foot, no numb or tingling.  States comfortable in sitting/no pain, but does have pain with Transitions of sitting/standing.  She has been mostly standing, reports about 12 hrs day. Because she has pain with these transition motions. Standing with Most weight on L LE, offloading R LE. She does  have a cane that she has been using, but did not bring today. She is limping with pain upon walking into clinic today.    PERTINENT HISTORY: N/A  PAIN:  Are you having pain? Yes: NPRS  scale: 4- 8 /10 Pain location: R leg pain Pain description: pain down leg, radiating Aggravating factors: transitions from sitting to standing and walking Relieving factors: none stated, standing with offloading of R LE.   PRECAUTIONS: None  WEIGHT BEARING RESTRICTIONS: No  FALLS:  Has patient fallen in last 6 months? No   PLOF: Independent   PATIENT GOALS:  Decreased pain in R leg, return to regular activities.   NEXT MD VISIT:   OBJECTIVE:   DIAGNOSTIC FINDINGS:   PATIENT SURVEYS:  Next visit    COGNITION: Overall cognitive status: Within functional limits for tasks assessed     SENSATION: WFL  EDEMA:   Mild in R lower leg/foot,  moderate in L lower leg/foot -ongoing for about 2 weeks per pt. Has not seen pcp for this.   POSTURE:    No Significant postural limitations  PALPATION: no pain to palpate today in low back, glute, or hip.    LOWER EXTREMITY ROM: Lumbar: flexion: mild limitation,   Ext: test next visit.  Hips: WFL, mild limitation for R ER.   LOWER EXTREMITY MMT:  MMT Left eval Right  eval  Hip flexion  4 (pain)  Hip extension    Hip abduction  4  Hip adduction    Hip internal rotation    Hip external rotation  4  Knee flexion    Knee extension    Ankle dorsiflexion    Ankle plantarflexion    Ankle inversion    Ankle eversion     (Blank rows = not tested)  LOWER EXTREMITY SPECIAL TESTS:  Neg SLR   FUNCTIONAL TESTS:  Pain with standing/loading of R LE. Immediate pain with standing up from seated position.   GAIT: Distance walked: 50 ft Assistive device utilized: Single point cane Level of assistance: Modified independence Comments: antalgic, decreased weight acceptance on R due to pain, short step length.  Tried walking (out of clinic) with RW, much improved stride length, and pain x 50 ft    TODAY'S TREATMENT:                                                                                                                              DATE:   09/03/2023  Therapeutic Exercise: Aerobic: bike L1 x 7 min Supine:  TA 5 sec  2 x 10;  S/L: hip abd x 20 bil;  Seated:  LAQ 2 x 10 bil;  Standing:    Stretches:  Neuromuscular Re-education: Manual Therapy:   Therapeutic Activity:  Self Care:   Gait:   Ambulation with SPC 45 ft x 6 , cueing for optimal sequencing, and recommended use for  offloading R LE, upright posture   Previous:  Therapeutic Exercise: Aerobic:  Supine:   Prone: prone on 2 pillows x 3 min, on 1 pillow x 1 min, flat x 1 min, Prone on elbows x 2 min,  Prone press ups 2 x 10;  ( no pain  in this position)  S/L:  Seated:   Standing:    Stretches:  Neuromuscular  Re-education: Manual Therapy:  LAD R LE,  Therapeutic Activity:  Self Care:   Gait:  attempts for weight bearing on R LE, attempts for weight shifts, Ambulation with RW x 40 ft, cueing for optimal walker use, for offloading R LE.     Therapeutic Exercise: Aerobic:  Supine:  LTR x 15/slow;    TA with education on breathing and optimal contraction;  Supine march with TA x 10 S/L: hip abd x 10 on R;  Seated:  sit to stand x10;   ball rolls for lumbar flexion x 12 Standing:    Stretches:  Neuromuscular Re-education: Manual Therapy:  Therapeutic Activity:  Self Care:   Gait:     PATIENT EDUCATION:  Education details: PT POC, Exam findings, HEP Person educated: Patient Education method: Explanation, Demonstration, Tactile cues, Verbal cues, and Handouts Education comprehension: verbalized understanding, returned demonstration, verbal cues required, tactile cues required, and needs further education   HOME EXERCISE PROGRAM: Access Code: [email or web address] URL: [email or web address] Date: 07/30/2023 Prepared by: Sedalia Muta  Exercises - Supine Posterior Pelvic Tilt  - 2 x daily - 1-2 sets - 10 reps - Hooklying Single Knee to Chest Stretch  - 2-3 x daily - 3 reps - 30 hold - Supine Piriformis Stretch Pulling Heel to Hip  - 2-3 x daily - 3 reps - 20 hold - Supine Diaphragmatic Breathing  - Seated Ankle Pumps on Table  - 3 x daily - 1-2 sets - 10 reps - Seated Hamstring Stretch  - 2 x daily - 3 reps - 30 hold  ASSESSMENT:  CLINICAL IMPRESSION: Pt with some improvement in pain in LE today, likley from taking anti-inflammatory. She has improved gait pattern and ability and improved weight acceptance on R LE, continues to require encouragement to use AD for offloading. She has good tolerance for bike today, but does have slightly increased LE pain after use, still improved from previous weeks without taking anti-inflammatory. Pt waiting for MRI, plan to see 1 x/ wk until then.    Eval: Patient presents with primary complaint of  pain in R leg. She has radiating pain in R leg ongoing since December. She has increased swelling in both feet and lower legs today. Recommended she call PCP today. At end of session, pt states that she has been standing for about 12 hrs/day to try to keep back less painful, so it is likely that feet/legs are swollen from this change of activity, but will send to PCP to make sure. Pt with symptoms of sciatic pain in R LE. She has no pain in seated or hooklying positions today or with light ther ex performed. Discussed trying to sit more often at home for pressure relief.  Pt with significant pain with standing/loading of R LE. Educated on use of SPC or walker for pressure relief as needed. Symptoms consistent with lumbar radiculopathy. Pt with decreased ability for full functional activities due to significant pain. Pt will  benefit from skilled PT to improve deficits and pain and to return to PLOF.   OBJECTIVE IMPAIRMENTS: Abnormal gait, decreased activity tolerance, decreased knowledge of use of DME, decreased mobility, difficulty walking, decreased ROM, decreased strength, increased muscle spasms, improper body mechanics, and pain.   ACTIVITY LIMITATIONS: lifting, bending, standing, squatting, sleeping, stairs, transfers, bed mobility, dressing, hygiene/grooming, locomotion level, and caring for others  PARTICIPATION LIMITATIONS: meal prep, cleaning, laundry, driving, shopping, and community  activity  PERSONAL FACTORS: Time since onset of injury/illness/exacerbation are also affecting patient's functional outcome.   REHAB POTENTIAL: Good  CLINICAL DECISION MAKING: Evolving/moderate complexity  EVALUATION COMPLEXITY: Moderate   GOALS: Goals reviewed with patient? Yes  SHORT TERM GOALS: Target date: 08/13/2023  Pt to be independent with initial HEP  Goal status: INITIAL  2.  Pt to report taking seated or supine rest/decompression breaks  throughout the day to offload back.   Goal status: INITIAL  3.  Pt to demo optimal mechanics with use of SPC as needed for pain/offloading of back.   Goal status: INITIAL    LONG TERM GOALS: Target date: 09/24/2023    Pt to be independent with final HEP  Goal status: INITIAL  2.  Pt to demo improved pain in R LE to 0-3/10 with standing/walking activities for at least 30 min.   Goal status: INITIAL  3.  Pt to demo ability for walking, stairs, and bend/squat with pain 0-3/10  Goal status: INITIAL  4.  Pt to demo optimal gait mechanics with LRAD, for at least 500 ft, to improve ability for community distances.   Goal status: INITIAL  5.  Pt to report at least 75 % improvement in overall symptoms.   Goal status: INITIAL   PLAN:  PT FREQUENCY: 1-2x/week  PT DURATION: 8 weeks  PLANNED INTERVENTIONS: Therapeutic exercises, Therapeutic activity, Neuromuscular re-education, Patient/Family education, Self Care, Joint mobilization, Joint manipulation, Stair training, Orthotic/Fit training, DME instructions, Aquatic Therapy, Dry Needling, Electrical stimulation, Cryotherapy, Moist heat, Taping, Ultrasound, Ionotophoresis 4mg /ml Dexamethasone, Manual therapy,  Vasopneumatic device, Traction, Spinal manipulation, Spinal mobilization,Balance training, Gait training,   PLAN FOR NEXT SESSION:    Sedalia Muta, PT, DPT 11:52 AM  09/03/23

## 2023-09-06 ENCOUNTER — Encounter: Payer: Medicare HMO | Admitting: Physical Therapy

## 2023-09-07 ENCOUNTER — Ambulatory Visit
Admission: RE | Admit: 2023-09-07 | Discharge: 2023-09-07 | Disposition: A | Discharge status: Home / Self Care | Source: Ambulatory Visit | Attending: Family Medicine | Admitting: Family Medicine

## 2023-09-07 DIAGNOSIS — M4316 Spondylolisthesis, lumbar region: Secondary | ICD-10-CM | POA: Diagnosis not present

## 2023-09-07 DIAGNOSIS — G8929 Other chronic pain: Secondary | ICD-10-CM

## 2023-09-10 ENCOUNTER — Telehealth: Payer: Self-pay

## 2023-09-10 NOTE — Telephone Encounter (Signed)
 Copied from CRM [id number]. Topic: General - Other >> Sep 09, 2023 11:18 AM [place] J wrote: Reason for CRM: Patient wanted to let Lauren know that MRI was able to be completed.  Please see pt call msg as FYI

## 2023-09-15 ENCOUNTER — Other Ambulatory Visit

## 2023-09-25 ENCOUNTER — Ambulatory Visit: Admitting: Physical Therapy

## 2023-09-25 ENCOUNTER — Encounter: Payer: Self-pay | Admitting: Physical Therapy

## 2023-09-25 ENCOUNTER — Encounter: Payer: Self-pay | Admitting: Family Medicine

## 2023-09-25 DIAGNOSIS — M5416 Radiculopathy, lumbar region: Secondary | ICD-10-CM

## 2023-09-25 NOTE — Therapy (Addendum)
 " OUTPATIENT PHYSICAL THERAPY LOWER EXTREMITY TREATMENT/Re-Cert    Patient Name: Alison Lamb MRN: [id number] DOB:1940/08/10, 83 y.o., female Today's Date: 09/30/2023  END OF SESSION:  PT End of Session - 09/30/23 1602     Visit Number 10    Number of Visits 16    Date for PT Re-Evaluation 11/25/23    Authorization Type [employer], re-cert done at visit 10    PT Start Time 0937    PT Stop Time 1015    PT Time Calculation (min) 38 min    Activity Tolerance Patient tolerated treatment well    Behavior During Therapy WFL for tasks assessed/performed                 Past Medical History:  Diagnosis Date   Complication of anesthesia 20 YRS AGO   SLOW TO AWAKEN AFTER CHOLECYSTECTOMY   Family history of adverse reaction to anesthesia 1981   FATHER DIED DURING COLON SURGERY DID NOT WAKE UP AFTER SURGERY FEW DETAILS GIVEN TO PATIENT    Family history of anesthesia complication 03/2017   SISTER WITH PAIN AFTER GALLBLADDER SURGERY, YOUNGER SISTER TROUBLE TROUBLE WAKING UP AFTER KNEE SURGERY IN [facility] 2018   Past Surgical History:  Procedure Laterality Date   BASAL CELL CARCINOMA EXCISION     SEVERAL REMOVED   CHOLECYSTECTOMY  2000   COLONSCOPY  EVERY 5 YEARS   DIAGNOSTIC LAPAROSCOPY  1984   FOR OVARY CYST   FOOT SURGERY Right 1990'S   BONE SURGERY   KNEE ARTHROSCOPY WITH MEDIAL MENISECTOMY Left 06/21/2017   Procedure: LEFT KNEE ARTHROSCOPY WITH MEDIAL MENISCAL DEBRIDEMENT;  Surgeon: Heide Ingle, MD;  Location: [facility];  Service: Orthopedics;  Laterality: Left;   MOES PROCEDURE     X 2   MULTIPLE TOOTH EXTRACTIONS     TONSILLECTOMY  AGE 13   TUBAL LIGATION  1974   Patient Active Problem List   Diagnosis Date Noted   Diverticular disease of colon 11/27/2021   Hypertension 11/27/2021   Hypertriglyceridemia 11/27/2021   Osteopenia 05/29/2021   Bilateral hearing loss 12/12/2018   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 Pt states continued pain,but feels she is improving. Having less pain overall, and doing a bit better getting around. She did have MRI that just resulted today.   Eval: Pt states ongoing pain down R leg. States minimal low back pain.  Previous pain in May- same but only for 2 weeks and then subsided. Now has been hurting since December.  Activities:  Normally active and goes to [employer]. Noted swelling in bil L>R ankles and feet today.  Pt states most Pain in back of glute and back of thigh, into ankle, not into foot, no numb or tingling.  States comfortable in sitting/no pain, but does have pain with Transitions of sitting/standing.  She has been mostly standing, reports about 12 hrs day. Because she has pain with these transition motions. Standing with Most weight on L LE, offloading R LE. She does  have a cane that she has been using, but did not bring today. She is limping with pain upon walking into  clinic today.    PERTINENT HISTORY: N/A  PAIN:  Are you having pain? Yes: NPRS scale: 4- 8 /10 Pain location: R leg pain Pain description: pain down leg, radiating Aggravating factors: transitions from sitting to standing and walking Relieving factors: none stated, standing with offloading of R LE.   PRECAUTIONS: None  WEIGHT BEARING RESTRICTIONS: No  FALLS:  Has patient fallen in last 6 months? No   PLOF: Independent   PATIENT GOALS:  Decreased pain in R leg, return to regular activities.   NEXT MD VISIT:   OBJECTIVE: updated 09/25/23  DIAGNOSTIC FINDINGS:   PATIENT SURVEYS:  Next visit   COGNITION: Overall cognitive status: Within functional  limits for tasks assessed     SENSATION: WFL  EDEMA: .   POSTURE:    No Significant postural limitations  PALPATION:    LOWER EXTREMITY ROM: Lumbar: flexion: mild limitation,   Ext: mod limitation/pain Hips: WFL, mild limitation for R ER.   LOWER EXTREMITY MMT:  MMT Left eval Right  eval Right   Hip flexion  4 (pain) 4  Hip extension     Hip abduction  4 4  Hip adduction     Hip internal rotation     Hip external rotation  4 4  Knee flexion     Knee extension     Ankle dorsiflexion     Ankle plantarflexion     Ankle inversion     Ankle eversion      (Blank rows = not tested)  LOWER EXTREMITY SPECIAL TESTS:  Neg SLR   FUNCTIONAL TESTS:  Pain with standing/loading of R LE.    GAIT: Distance walked: 50 ft Assistive device utilized: Single point cane Level of assistance: Modified independence Comments: antalgic,   TODAY'S TREATMENT:                                                                                                                              DATE:   09/25/2023  Therapeutic Exercise: Aerobic: bike L1 x 7 min Supine:  TA 5 sec  2 x 10;  SLR x 10 bil;  S/L: hip abd x 20 bil;  Seated:  LAQ 2 x 10 bil;  Standing:    Stretches:  SKTC 30 sec x 3 bil;  Neuromuscular Re-education: Manual Therapy:   Therapeutic Activity:  Self Care:   Gait:   Ambulation with SPC 45 ft x 6 , cueing for optimal sequencing, and recommended use for  offloading R LE, upright posture   Previous:  Therapeutic Exercise: Aerobic:  Supine:   Prone: prone on 2 pillows x 3 min, on 1 pillow x 1 min, flat x 1 min, Prone on elbows x 2 min,  Prone press ups 2 x 10;  ( no pain in this position)  S/L:  Seated:   Standing:    Stretches:  Neuromuscular Re-education: Manual Therapy:  LAD R LE,  Therapeutic Activity:  Self Care:  Gait:  attempts for weight bearing on R LE, attempts for weight shifts, Ambulation with RW x 40 ft, cueing for optimal walker use, for offloading R  LE.    PATIENT EDUCATION:  Education details: PT POC, Exam findings, HEP Person educated: Patient Education method: Explanation, Demonstration, Tactile cues, Verbal cues, and Handouts Education comprehension: verbalized understanding, returned demonstration, verbal cues required, tactile cues required, and needs further education   HOME EXERCISE PROGRAM: Access Code: [email or web address] URL: [email or web address] Date: 07/30/2023 Prepared by: Tinnie Don  Exercises - Supine Posterior Pelvic Tilt  - 2 x daily - 1-2 sets - 10 reps - Hooklying Single Knee to Chest Stretch  - 2-3 x daily - 3 reps - 30 hold - Supine Piriformis Stretch Pulling Heel to Hip  - 2-3 x daily - 3 reps - 20 hold - Supine Diaphragmatic Breathing  - Seated Ankle Pumps on Table  - 3 x daily - 1-2 sets - 10 reps - Seated Hamstring Stretch  - 2 x daily - 3 reps - 30 hold  ASSESSMENT:  CLINICAL IMPRESSION: Pt with overall improving mobility. She has some improvement of ability for weight bearing through R LE for standing and with gait, with less antalgia and compensation. She has good tolerance for exercises today. She does have continued pain into LE with loading of R side of back, that continues to limit her functional activity level. She will call today for f/u with MD to go over MRI and plan for intervention due to continued pain. Pt will benefit from continued care for strengthening as able.   Eval: Patient presents with primary complaint of  pain in R leg. She has radiating pain in R leg ongoing since December. She has increased swelling in both feet and lower legs today. Recommended she call PCP today. At end of session, pt states that she has been standing for about 12 hrs/day to try to keep back less painful, so it is likely that feet/legs are swollen from this change of activity, but will send to PCP to make sure. Pt with symptoms of sciatic pain in R LE. She has no pain in seated or hooklying positions  today or with light ther ex performed. Discussed trying to sit more often at home for pressure relief.  Pt with significant pain with standing/loading of R LE. Educated on use of SPC or walker for pressure relief as needed. Symptoms consistent with lumbar radiculopathy. Pt with decreased ability for full functional activities due to significant pain. Pt will  benefit from skilled PT to improve deficits and pain and to return to PLOF.   OBJECTIVE IMPAIRMENTS: Abnormal gait, decreased activity tolerance, decreased knowledge of use of DME, decreased mobility, difficulty walking, decreased ROM, decreased strength, increased muscle spasms, improper body mechanics, and pain.   ACTIVITY LIMITATIONS: lifting, bending, standing, squatting, sleeping, stairs, transfers, bed mobility, dressing, hygiene/grooming, locomotion level, and caring for others  PARTICIPATION LIMITATIONS: meal prep, cleaning, laundry, driving, shopping, and community activity  PERSONAL FACTORS: Time since onset of injury/illness/exacerbation are also affecting patient's functional outcome.   REHAB POTENTIAL: Good  CLINICAL DECISION MAKING: Evolving/moderate complexity  EVALUATION COMPLEXITY: Moderate   GOALS: Goals reviewed with patient? Yes  SHORT TERM GOALS: Target date: 08/13/2023  Pt to be independent with initial HEP  Goal status: MET  2.  Pt to report taking seated or supine rest/decompression breaks throughout the day to offload back.   Goal status: MET  3.  Pt to demo optimal mechanics with use of  SPC as needed for pain/offloading of back.   Goal status: MET    LONG TERM GOALS: Target date: 6/2 /2025    Pt to be independent with final HEP  Goal status: in progress   2.  Pt to demo improved pain in R LE to 0-3/10 with standing/walking activities for at least 30 min.   Goal status: in progress   3.  Pt to demo ability for walking, stairs, and bend/squat with pain 0-3/10  Goal status: in progress    4.  Pt to demo optimal gait mechanics with LRAD, for at least 500 ft, to improve ability for community distances.   Goal status: in progress   5.  Pt to report at least 75 % improvement in overall symptoms.   Goal status: in progress    PLAN:  PT FREQUENCY: 1-2x/week  PT DURATION: 8 weeks  PLANNED INTERVENTIONS: Therapeutic exercises, Therapeutic activity, Neuromuscular re-education, Patient/Family education, Self Care, Joint mobilization, Joint manipulation, Stair training, Orthotic/Fit training, DME instructions, Aquatic Therapy, Dry Needling, Electrical stimulation, Cryotherapy, Moist heat, Taping, Ultrasound, Ionotophoresis 4mg /ml Dexamethasone , Manual therapy,  Vasopneumatic device, Traction, Spinal manipulation, Spinal mobilization,Balance training, Gait training,   PLAN FOR NEXT SESSION:    Tinnie Don, PT, DPT 4:03 PM  09/30/23   PHYSICAL THERAPY DISCHARGE SUMMARY  Visits from Start of Care: 10  Plan: Patient agrees to discharge.  Patient goals were partially  met. Patient is being discharged due to - pt did not return after last visit. SABRA Tinnie Don, PT, DPT 9:17 AM  07/13/24    "

## 2023-09-25 NOTE — Progress Notes (Signed)
 Low back MRI shows multiple locations that could cause back pain and pain down your leg.  Recommend return to clinic to go over results in full detail and plan for back injections.

## 2023-09-26 ENCOUNTER — Encounter: Payer: Self-pay | Admitting: Family Medicine

## 2023-09-26 ENCOUNTER — Ambulatory Visit: Admitting: Family Medicine

## 2023-09-26 VITALS — BP 130/84 | HR 63 | Ht 66.0 in

## 2023-09-26 DIAGNOSIS — M5441 Lumbago with sciatica, right side: Secondary | ICD-10-CM

## 2023-09-26 DIAGNOSIS — G8929 Other chronic pain: Secondary | ICD-10-CM | POA: Diagnosis not present

## 2023-09-26 NOTE — Patient Instructions (Addendum)
 Thank you for coming in today.  Please call [facility] ([facility]) at [phone] to schedule your spine injection.    Check back as needed

## 2023-09-26 NOTE — Progress Notes (Signed)
 I, Stevenson Clinch, CMA acting as a scribe for Alison Graham, MD.  Alison Lamb is a 83 y.o. female who presents to [facility] today for f/u LBP w/ MRI review. Pt was last seen by Dr. Denyse Lamb on 08/26/23 and she was prescribed lorazepam for MRI and advised to cont PT, completing 10 visits.  Today, pt reports continued pain in the lower back, hip, and gluteal region. PT has been some helpful for sx, compliant with HEP, taking IBU prn. Review MRI today.   Pain is located right lower back radiating down the posterior aspect of right leg and calf.  Dx imaging: 09/07/23 L-spine MRI 07/05/23 L-spine & R hip XR   Pertinent review of systems: No fevers or chills  Relevant historical information: Hypertension   Exam:  BP 130/84   Pulse 63   Ht 5\' 6"  (1.676 m)   SpO2 98%   BMI 27.44 kg/m  General: Well Developed, well nourished, and in no acute distress.   MSK: L spine nontender to palpation normal lumbar motion.    Lab and Radiology Results  EXAM: MRI LUMBAR SPINE WITHOUT CONTRAST   TECHNIQUE: Multiplanar, multisequence MR imaging of the lumbar spine was performed. No intravenous contrast was administered.   COMPARISON:  Lumbar radiographs 07/05/2023. CT Abdomen and Pelvis 07/16/2013.   FINDINGS: Segmentation:  Normal on the comparisons.   Alignment: Lumbar lordosis has not significantly changed from the 2015 CT except for new grade 1 anterolisthesis of L5 on S1, measuring 2-3 mm. Mild chronic retrolisthesis of L1 on L2 also noted. No significant scoliosis.   Vertebrae: Normal background bone marrow signal. Intact visible sacrum and SI joints. Scattered benign vertebral body hemangiomas, most notably at L4 (normal variant). No marrow edema or evidence of acute osseous abnormality.   Conus medullaris and cauda equina: Conus extends to the L1-L2 level. No lower spinal cord or conus signal abnormality. Generally normal cauda equina  nerve roots, capacious spinal canal at most levels.   Paraspinal and other soft tissues: Chronic benign renal parapelvic cysts, also seen in 2015 (no follow-up imaging recommended). Negative visualized posterior paraspinal soft tissues. Extensive diverticulosis of distal large bowel visible in the pelvis.   Disc levels:   T11-T12 and T12-L1:  Negative.   L1-L2: Mild chronic retrolisthesis. Mild disc bulging and facet hypertrophy. Mild left L1 neural foraminal stenosis.   L2-L3: Mild asymmetric disc bulging to the left, broad-based involvement of the left neural foramen. Mild facet hypertrophy. No spinal or lateral recess stenosis. Mild to moderate left L2 foraminal stenosis.   L3-L4: Less pronounced circumferential disc bulge and less asymmetric. Mild facet and ligament flavum hypertrophy. No stenosis.   L4-L5: Mild circumferential disc bulge slightly asymmetric to the left. Moderate ligament flavum, mild to moderate facet hypertrophy. No significant spinal stenosis. Borderline to mild lateral recess stenosis (L5 nerve levels). Mild bilateral L4 foraminal stenosis.   L5-S1: Grade 1 anterolisthesis. Mild circumferential disc/pseudo disc. Moderate to severe facet hypertrophy and degeneration with bilateral degenerative facet joint fluid (series 8, image 33) and bilateral degenerative synovial cysts. Left-side cyst is posteriorly situated and should not cause neural compromise. However, there is an anteriorly situated right side synovial cyst measuring about 6 mm and projecting into the right neural foramen (series 8, image 33 and series 5, image 3). And the right posterior element degeneration also results in asymmetric right lateral recess stenosis which appears moderate on series 8, image 34 (descending right S1 nerve level. No  spinal stenosis. Mild left but moderate right L5 neural foraminal stenosis.   IMPRESSION: 1. 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 No significant spinal stenosis, but moderate right lateral recess and right foraminal stenosis in part due to a right foraminal synovial cyst. Query right L5 and/or S1 radiculitis. 2. No lumbar spinal stenosis elsewhere. Asymmetric leftward disc degeneration at L2-L3 with disc protrusion affecting the left neural foramen there. Query Left L2 radiculitis. Lesser disc and foraminal asymmetry at L3-L4, left L3 nerve level. 3. Diverticulosis of the distal large bowel.     Electronically Signed   By: Alison Lamb M.D.   On: 09/25/2023 04:02 I, Alison Lamb, personally (independently) visualized and performed the interpretation of the images attached in this note.     Assessment and Plan: 83 y.o. female with lumbar radiculopathy with some chronic left low back pain.  Pain is multifactorial.  She does have spondylolisthesis at L5-S1 with nerve impingement and a synovial cyst that all could impact potential pinched nerve at right L5 or S1.  This does fit with her current pain pattern.  Unfortunately she has failed to improve with physical therapy.  She is very much against the idea of surgery.  We talked about options and reviewed her MRI documentation and imaging.  Plan for epidural steroid injection targeting the L5-S1 area on the right.  Could you nerve root block and transforaminal injection or interlaminar injection.  If this does not work consider facet injection on the right L5-S1 as well. We talked about doing potentially 2 different epidural steroid injections and potentially a facet injection.  If that does not work it may be reasonable to talk with the Careers adviser.  Again she is very much not excited about surgery.   PDMP not reviewed this encounter. Orders Placed This Encounter  Procedures   DG Epidural/Nerve Root    ESI or nerve root block or both Level & technique per  radiology, suggest possible R L5-S1    Standing Status:   Future    Expiration Date:   09/25/2024    Reason for Exam (SYMPTOM  OR DIAGNOSIS REQUIRED):   lumbar radiculopathy    Preferred Imaging Location?:   [facility]   No orders of the defined types were placed in this encounter.    Discussed warning signs or symptoms. Please see discharge instructions. Patient expresses understanding.   The above documentation has been reviewed and is accurate and complete Alison Lamb, M.D. Total encounter time 30 minutes including face-to-face time with the patient and, reviewing past medical record, and charting on the date of service.

## 2023-09-30 ENCOUNTER — Encounter: Payer: Self-pay | Admitting: Family Medicine

## 2023-09-30 ENCOUNTER — Encounter: Payer: Self-pay | Admitting: Physical Therapy

## 2023-10-03 NOTE — Discharge Instructions (Signed)

## 2023-10-04 ENCOUNTER — Ambulatory Visit
Admission: RE | Admit: 2023-10-04 | Discharge: 2023-10-04 | Disposition: A | Discharge status: Home / Self Care | Source: Ambulatory Visit | Attending: Family Medicine | Admitting: Family Medicine

## 2023-10-04 DIAGNOSIS — G8929 Other chronic pain: Secondary | ICD-10-CM

## 2023-10-04 DIAGNOSIS — M4317 Spondylolisthesis, lumbosacral region: Secondary | ICD-10-CM | POA: Diagnosis not present

## 2023-10-04 DIAGNOSIS — M7138 Other bursal cyst, other site: Secondary | ICD-10-CM | POA: Diagnosis not present

## 2023-10-04 DIAGNOSIS — M48061 Spinal stenosis, lumbar region without neurogenic claudication: Secondary | ICD-10-CM | POA: Diagnosis not present

## 2023-10-04 DIAGNOSIS — M4727 Other spondylosis with radiculopathy, lumbosacral region: Secondary | ICD-10-CM | POA: Diagnosis not present

## 2023-10-04 MED ORDER — METHYLPREDNISOLONE ACETATE 40 MG/ML INJ SUSP (RADIOLOG
80.0000 mg | Freq: Once | INTRAMUSCULAR | Status: AC
  Administered 2023-10-04: 80 mg via EPIDURAL

## 2023-10-04 MED ORDER — IOPAMIDOL (ISOVUE-M 200) INJECTION 41%
1.0000 mL | Freq: Once | INTRAMUSCULAR | Status: AC
  Administered 2023-10-04: 1 mL via EPIDURAL

## 2024-01-01 DIAGNOSIS — Z1231 Encounter for screening mammogram for malignant neoplasm of breast: Secondary | ICD-10-CM | POA: Diagnosis not present

## 2024-01-03 DIAGNOSIS — N6342 Unspecified lump in left breast, subareolar: Secondary | ICD-10-CM | POA: Diagnosis not present

## 2024-01-03 DIAGNOSIS — R92322 Mammographic fibroglandular density, left breast: Secondary | ICD-10-CM | POA: Diagnosis not present

## 2024-01-03 DIAGNOSIS — R928 Other abnormal and inconclusive findings on diagnostic imaging of breast: Secondary | ICD-10-CM | POA: Diagnosis not present

## 2024-01-27 DIAGNOSIS — M25522 Pain in left elbow: Secondary | ICD-10-CM | POA: Diagnosis not present

## 2024-02-27 DIAGNOSIS — I10 Essential (primary) hypertension: Secondary | ICD-10-CM | POA: Diagnosis not present

## 2024-03-12 DIAGNOSIS — N8111 Cystocele, midline: Secondary | ICD-10-CM | POA: Diagnosis not present

## 2024-03-12 DIAGNOSIS — N952 Postmenopausal atrophic vaginitis: Secondary | ICD-10-CM | POA: Diagnosis not present

## 2024-03-12 DIAGNOSIS — N362 Urethral caruncle: Secondary | ICD-10-CM | POA: Diagnosis not present

## 2024-03-19 DIAGNOSIS — N939 Abnormal uterine and vaginal bleeding, unspecified: Secondary | ICD-10-CM | POA: Diagnosis not present

## 2024-03-19 DIAGNOSIS — N368 Other specified disorders of urethra: Secondary | ICD-10-CM | POA: Diagnosis not present

## 2024-04-08 DIAGNOSIS — H6123 Impacted cerumen, bilateral: Secondary | ICD-10-CM | POA: Diagnosis not present

## 2024-05-29 DIAGNOSIS — I499 Cardiac arrhythmia, unspecified: Secondary | ICD-10-CM | POA: Diagnosis not present

## 2024-06-15 ENCOUNTER — Other Ambulatory Visit: Payer: Self-pay | Admitting: *Deleted

## 2024-06-15 ENCOUNTER — Ambulatory Visit: Attending: Physician Assistant

## 2024-06-15 DIAGNOSIS — R55 Syncope and collapse: Secondary | ICD-10-CM

## 2024-06-15 DIAGNOSIS — I499 Cardiac arrhythmia, unspecified: Secondary | ICD-10-CM

## 2024-06-15 DIAGNOSIS — R002 Palpitations: Secondary | ICD-10-CM

## 2024-06-15 NOTE — Progress Notes (Unsigned)
 Enrolled for [employer] to mail a ZIO XT long term holter monitor to the patients address on file.   EP to read

## 2024-06-23 ENCOUNTER — Ambulatory Visit

## 2024-06-23 ENCOUNTER — Other Ambulatory Visit: Payer: Self-pay | Admitting: *Deleted

## 2024-06-23 DIAGNOSIS — R002 Palpitations: Secondary | ICD-10-CM

## 2024-06-23 DIAGNOSIS — R55 Syncope and collapse: Secondary | ICD-10-CM

## 2024-06-23 DIAGNOSIS — I499 Cardiac arrhythmia, unspecified: Secondary | ICD-10-CM

## 2024-06-23 NOTE — Progress Notes (Unsigned)
 Enrolled for [employer] to mail a ZIO XT long term holter monitor to the patients address on file.   EP to read
